# Patient Record
Sex: Female | Born: 1970 | Race: White | Hispanic: No | Marital: Single | Smoking: Current every day smoker
Health system: Southern US, Community
[De-identification: ages and names within clinical notes are randomized; demographics above are authoritative.]

## PROBLEM LIST (undated history)

## (undated) DIAGNOSIS — I10 Essential (primary) hypertension: Secondary | ICD-10-CM

## (undated) DIAGNOSIS — J40 Bronchitis, not specified as acute or chronic: Secondary | ICD-10-CM

## (undated) DIAGNOSIS — G43909 Migraine, unspecified, not intractable, without status migrainosus: Secondary | ICD-10-CM

## (undated) DIAGNOSIS — D72829 Elevated white blood cell count, unspecified: Secondary | ICD-10-CM

## (undated) DIAGNOSIS — E785 Hyperlipidemia, unspecified: Secondary | ICD-10-CM

## (undated) HISTORY — DX: Hyperlipidemia, unspecified: E78.5

## (undated) HISTORY — DX: Migraine, unspecified, not intractable, without status migrainosus: G43.909

## (undated) HISTORY — DX: Elevated white blood cell count, unspecified: D72.829

## (undated) HISTORY — DX: Essential (primary) hypertension: I10

---

## 2004-02-02 ENCOUNTER — Other Ambulatory Visit: Admission: RE | Admit: 2004-02-02 | Discharge: 2004-02-02 | Payer: Self-pay | Admitting: Family Medicine

## 2005-01-09 ENCOUNTER — Other Ambulatory Visit: Admission: RE | Admit: 2005-01-09 | Discharge: 2005-01-09 | Payer: Self-pay | Admitting: Family Medicine

## 2006-01-23 ENCOUNTER — Other Ambulatory Visit: Admission: RE | Admit: 2006-01-23 | Discharge: 2006-01-23 | Payer: Self-pay | Admitting: Family Medicine

## 2007-03-20 ENCOUNTER — Ambulatory Visit: Payer: Self-pay | Admitting: Oncology

## 2007-04-17 HISTORY — PX: TUBAL LIGATION: SHX77

## 2007-05-01 ENCOUNTER — Encounter: Payer: Self-pay | Admitting: Oncology

## 2007-05-01 ENCOUNTER — Other Ambulatory Visit: Admission: RE | Admit: 2007-05-01 | Discharge: 2007-05-01 | Payer: Self-pay | Admitting: Oncology

## 2007-05-01 LAB — TECHNOLOGIST REVIEW

## 2007-05-01 LAB — COMPREHENSIVE METABOLIC PANEL
ALT: 9 U/L (ref 0–35)
AST: 12 U/L (ref 0–37)
Alkaline Phosphatase: 70 U/L (ref 39–117)
Chloride: 102 mEq/L (ref 96–112)
Creatinine, Ser: 0.81 mg/dL (ref 0.40–1.20)
Total Bilirubin: 0.3 mg/dL (ref 0.3–1.2)

## 2007-05-01 LAB — CBC WITH DIFFERENTIAL/PLATELET
Basophils Absolute: 0.1 10*3/uL (ref 0.0–0.1)
Eosinophils Absolute: 0 10*3/uL (ref 0.0–0.5)
HCT: 41 % (ref 34.8–46.6)
HGB: 13.9 g/dL (ref 11.6–15.9)
MCH: 30.6 pg (ref 26.0–34.0)
MONO#: 1.1 10*3/uL — ABNORMAL HIGH (ref 0.1–0.9)
NEUT#: 7.4 10*3/uL — ABNORMAL HIGH (ref 1.5–6.5)
NEUT%: 29.7 % — ABNORMAL LOW (ref 39.6–76.8)
RDW: 13.9 % (ref 11.3–14.5)
WBC: 24.8 10*3/uL — ABNORMAL HIGH (ref 3.9–10.0)
lymph#: 16.1 10*3/uL — ABNORMAL HIGH (ref 0.9–3.3)

## 2007-05-05 LAB — FLOW CYTOMETRY

## 2007-07-02 ENCOUNTER — Ambulatory Visit: Payer: Self-pay | Admitting: Oncology

## 2007-07-04 LAB — CBC WITH DIFFERENTIAL/PLATELET
BASO%: 0.3 % (ref 0.0–2.0)
HCT: 39.7 % (ref 34.8–46.6)
MCHC: 35.2 g/dL (ref 32.0–36.0)
MONO#: 1 10*3/uL — ABNORMAL HIGH (ref 0.1–0.9)
NEUT%: 24.3 % — ABNORMAL LOW (ref 39.6–76.8)
RDW: 13.7 % (ref 11.3–14.5)
WBC: 23.2 10*3/uL — ABNORMAL HIGH (ref 3.9–10.0)
lymph#: 16.4 10*3/uL — ABNORMAL HIGH (ref 0.9–3.3)

## 2007-08-12 ENCOUNTER — Emergency Department (HOSPITAL_COMMUNITY): Admission: EM | Admit: 2007-08-12 | Discharge: 2007-08-12 | Payer: Self-pay | Admitting: Emergency Medicine

## 2007-09-22 ENCOUNTER — Ambulatory Visit: Payer: Self-pay | Admitting: Oncology

## 2007-10-24 ENCOUNTER — Encounter: Payer: Self-pay | Admitting: Family Medicine

## 2007-12-16 LAB — CONVERTED CEMR LAB: Pap Smear: NORMAL

## 2007-12-25 ENCOUNTER — Encounter: Admission: RE | Admit: 2007-12-25 | Discharge: 2007-12-25 | Payer: Self-pay | Admitting: Obstetrics and Gynecology

## 2008-04-14 ENCOUNTER — Ambulatory Visit (HOSPITAL_COMMUNITY): Admission: RE | Admit: 2008-04-14 | Discharge: 2008-04-14 | Payer: Self-pay | Admitting: Obstetrics and Gynecology

## 2008-11-08 ENCOUNTER — Ambulatory Visit: Payer: Self-pay | Admitting: Family Medicine

## 2008-11-08 DIAGNOSIS — E785 Hyperlipidemia, unspecified: Secondary | ICD-10-CM

## 2008-11-08 DIAGNOSIS — D72829 Elevated white blood cell count, unspecified: Secondary | ICD-10-CM

## 2008-11-08 DIAGNOSIS — I10 Essential (primary) hypertension: Secondary | ICD-10-CM

## 2008-11-08 HISTORY — DX: Essential (primary) hypertension: I10

## 2008-11-08 HISTORY — DX: Elevated white blood cell count, unspecified: D72.829

## 2008-11-08 HISTORY — DX: Hyperlipidemia, unspecified: E78.5

## 2009-04-26 ENCOUNTER — Telehealth: Payer: Self-pay | Admitting: Family Medicine

## 2009-09-21 ENCOUNTER — Ambulatory Visit: Payer: Self-pay | Admitting: Family Medicine

## 2009-09-21 DIAGNOSIS — L0291 Cutaneous abscess, unspecified: Secondary | ICD-10-CM

## 2009-09-21 DIAGNOSIS — L0390 Cellulitis, unspecified: Secondary | ICD-10-CM

## 2010-04-28 ENCOUNTER — Ambulatory Visit
Admission: RE | Admit: 2010-04-28 | Discharge: 2010-04-28 | Payer: Self-pay | Source: Home / Self Care | Attending: Family Medicine | Admitting: Family Medicine

## 2010-04-28 DIAGNOSIS — G43909 Migraine, unspecified, not intractable, without status migrainosus: Secondary | ICD-10-CM

## 2010-04-28 HISTORY — DX: Migraine, unspecified, not intractable, without status migrainosus: G43.909

## 2010-05-09 ENCOUNTER — Telehealth: Payer: Self-pay | Admitting: Family Medicine

## 2010-05-16 NOTE — Assessment & Plan Note (Signed)
Summary: EAR PAIN // RS   Vital Signs:  Patient profile:   40 year old female Menstrual status:  regular Temp:     98.0 degrees F oral  Vitals Entered By: Sid Falcon LPN (September 21, 9369 11:55 AM) CC: Rt ear pain, Hypertension Management   History of Present Illness: R ear pain since Monday. Pain in external canal.  No drainage. No fever or chills. Tetanus 2005.  Also needs refills hypertension meds.  No side effects and conpliant with all.  Hypertension History:      She denies headache, chest pain, palpitations, dyspnea with exertion, orthopnea, PND, peripheral edema, visual symptoms, neurologic problems, and syncope.  She notes no problems with any antihypertensive medication side effects.        Positive major cardiovascular risk factors include hyperlipidemia, hypertension, and current tobacco user.  Negative major cardiovascular risk factors include female age less than 79 years old.     Allergies (verified): No Known Drug Allergies  Past History:  Past Medical History: Last updated: 11/08/2008 Migraine headaches Chronic leukocytosis Hyperlipidemia Hypertension Leukocytosis  Review of Systems      See HPI  Physical Exam  General:  Well-developed,well-nourished,in no acute distress; alert,appropriate and cooperative throughout examination Ears:  R canal abscess with swelling and pustular change inf canal. Mouth:  Oral mucosa and oropharynx without lesions or exudates.  Teeth in good repair. Neck:  No deformities, masses, or tenderness noted. Lungs:  Normal respiratory effort, chest expands symmetrically. Lungs are clear to auscultation, no crackles or wheezes. Heart:  Normal rate and regular rhythm. S1 and S2 normal without gallop, murmur, click, rub or other extra sounds.   Impression & Recommendations:  Problem # 1:  ABSCESS, SKIN (ICD-682.9) Assessment New  Orders: I&D Abscess, Complex (10061)  Problem # 2:  HYPERTENSION (ICD-401.9) Assessment:  Unchanged  Her updated medication list for this problem includes:    Lisinopril 5 Mg Tabs (Lisinopril) ..... Once daily    Hydrochlorothiazide 12.5 Mg Caps (Hydrochlorothiazide) ..... Once daily  Complete Medication List: 1)  Lisinopril 5 Mg Tabs (Lisinopril) .... Once daily 2)  Hydrochlorothiazide 12.5 Mg Caps (Hydrochlorothiazide) .... Once daily 3)  Maxalt 10 Mg Tabs (Rizatriptan benzoate) .... One tab onset of headache, may repeat in 2 hours as needed max 2/24 4)  Naprosyn 500 Mg Tabs (Naproxen) .... One tab every 6 hours as needed for headache 5)  Metoclopramide Hcl 10 Mg Tabs (Metoclopramide hcl) .... One tab every 6 hours as needed for headache  Hypertension Assessment/Plan:      The patient's hypertensive risk group is category B: At least one risk factor (excluding diabetes) with no target organ damage.    Patient Instructions: 1)  Use warm compresses several times daily. Prescriptions: HYDROCHLOROTHIAZIDE 12.5 MG CAPS (HYDROCHLOROTHIAZIDE) once daily  #90 x 3   Entered and Authorized by:   Evelena Peat MD   Signed by:   Evelena Peat MD on 09/21/2009   Method used:   Faxed to ...       [facility] (retail)       [phone] [address]       [place], [place]  [address]       Ph: [phone]       Fax: [phone]   RxID:   [id number] LISINOPRIL 5 MG TABS (LISINOPRIL) once daily  #90 x 3   Entered and Authorized by:   Evelena Peat MD   Signed by:   Smitty Cords  Erasto Sleight MD on 09/21/2009   Method used:   Faxed to ...       [facility] (retail)       [phone] [address]       [place], [place]  [address]       Ph: [phone]       Fax: [phone]   RxID:   [id number]   Appended Document: EAR PAIN // RS Pt had noted abscess ear canal.  After discussing risks and benefits of I and D pt consented.  Anesth with 1% plain xylocaine and use #11 blade to lance abscess.  Hemostats used to explore wound cavity.  Abscess drained and pt  tolerated well with minimal bleeding.   Khamani Fairley.

## 2010-05-16 NOTE — Progress Notes (Signed)
Summary: REQ FOR REFILL ON MED Maxalt X 6 months  Phone Note Call from Patient Call back at [phone]   Caller: Patient [phone] Reason for Call: Refill Medication Summary of Call: Pt called to req a refill on med (Maxalt 10 mg).... Pt req that same be sent in to [facility]  [facility]  Initial call taken by: Debbra Riding,  April 26, 2009 11:16 AM  Follow-up for Phone Call        OK to refill Maxalt 10 one at onset of migraine and may repeat one in 2 hours as needed (max 2/24 hours) disp #6 with 6 refills. Follow-up by: Evelena Peat MD,  April 27, 2009 10:53 AM  Additional Follow-up for Phone Call Additional follow up Details #1::        Rx sent electronically    New/Updated Medications: MAXALT 10 MG TABS (RIZATRIPTAN BENZOATE) one tab onset of headache, may repeat in 2 hours as needed max 2/24 Prescriptions: MAXALT 10 MG TABS (RIZATRIPTAN BENZOATE) one tab onset of headache, may repeat in 2 hours as needed max 2/24  #6 x 6   Entered by:   Sid Falcon LPN   Authorized by:   Evelena Peat MD   Signed by:   Sid Falcon LPN on 56/21/3086   Method used:   Electronically to        [facility] (retail)       [address]       [place], [place]  [address]       Ph: [phone]       Fax: [phone]   RxID:   [id number]

## 2010-05-18 NOTE — Progress Notes (Signed)
Summary: One box of Maxalt (18 tabs) requested  Phone Note From Pharmacy   Caller: [facility] Call For: Kayla Mcintosh  Summary of Call: Ins is requiring a box of the Maxalt be disp to pt for cost purposes.  One box has 18 tabs.  New quantity and refills requested. Initial call taken by: Sid Falcon LPN,  May 09, 2010 2:37 PM  Follow-up for Phone Call        OK to refill for 18 tablets. Follow-up by: Evelena Peat MD,  May 09, 2010 2:44 PM    Prescriptions: MAXALT 10 MG TABS (RIZATRIPTAN BENZOATE) one tab onset of headache, may repeat in 2 hours as needed max 2/24  #18 x 3   Entered by:   Sid Falcon LPN   Authorized by:   Evelena Peat MD   Signed by:   Sid Falcon LPN on 96/07/5407   Method used:   Faxed to ...       [facility] (retail)       [phone] [address]       [place], [place]  [address]       Ph: [phone]       Fax: [phone]   RxID:   [id number]

## 2010-05-18 NOTE — Assessment & Plan Note (Signed)
Summary: med check/bp check/refill/cjr   Vital Signs:  Patient profile:   40 year old female Menstrual status:  regular Weight:      116 pounds Temp:     98.2 degrees F oral BP sitting:   108 / 68  (left arm) Cuff size:   regular  Vitals Entered By: Sid Falcon LPN (April 28, 2010 4:31 PM)  History of Present Illness: Here for follow up hypertension and migraine headaches  Rarely has migraines now that she is off OCPs. Uses Maxalt as needed and needs refills.  Supplements with Naprosyn and  Reglan.  BP meds reviewed and compliant with all.  No side effects.  Chronic leukocytosis.  Followed at [facility].  No hx of leukemia. Counts have been stable recently.  Hypertension History:      She denies headache, chest pain, palpitations, dyspnea with exertion, orthopnea, PND, peripheral edema, visual symptoms, neurologic problems, syncope, and side effects from treatment.  She notes no problems with any antihypertensive medication side effects.        Positive major cardiovascular risk factors include hyperlipidemia, hypertension, and current tobacco user.  Negative major cardiovascular risk factors include female age less than 76 years old.     Allergies (verified): No Known Drug Allergies  Past History:  Past Medical History: Last updated: 11/08/2008 Migraine headaches Chronic leukocytosis Hyperlipidemia Hypertension Leukocytosis  Family History: Last updated: 11/08/2008 Family History of CAD Female 1st degree relative <50 Family history breast cancer, grandparent Family History of Alcoholism/Addiction, parent, blood relative Family history lung cancer Family History Hypertension Family history heart disease  Social History: Last updated: 11/08/2008 Married Current Smoker Alcohol use-no  Risk Factors: Smoking Status: current (11/08/2008) PMH-FH-SH reviewed for relevance  Review of Systems  The patient denies anorexia, fever, weight loss, hoarseness, chest pain,  syncope, dyspnea on exertion, peripheral edema, prolonged cough, headaches, hemoptysis, abdominal pain, melena, hematochezia, severe indigestion/heartburn, hematuria, incontinence, muscle weakness, suspicious skin lesions, and depression.    Physical Exam  General:  Well-developed,well-nourished,in no acute distress; alert,appropriate and cooperative throughout examination Eyes:  pupils equal, pupils round, and pupils reactive to light.   Ears:  cerumen bilaterally. Mouth:  Oral mucosa and oropharynx without lesions or exudates.  Teeth in good repair. Neck:  No deformities, masses, or tenderness noted. Lungs:  Normal respiratory effort, chest expands symmetrically. Lungs are clear to auscultation, no crackles or wheezes. Heart:  Normal rate and regular rhythm. S1 and S2 normal without gallop, murmur, click, rub or other extra sounds. Extremities:  No clubbing, cyanosis, edema, or deformity noted with normal full range of motion of all joints.   Neurologic:  alert & oriented X3 and cranial nerves II-XII intact.   Skin:  no rashes and no suspicious lesions.   Cervical Nodes:  No lymphadenopathy noted Psych:  normally interactive, good eye contact, not anxious appearing, and not depressed appearing.     Impression & Recommendations:  Problem # 1:  HYPERTENSION (ICD-401.9)  Her updated medication list for this problem includes:    Lisinopril 5 Mg Tabs (Lisinopril) ..... Once daily    Hydrochlorothiazide 12.5 Mg Caps (Hydrochlorothiazide) ..... Once daily  Problem # 2:  LEUKOCYTOSIS UNSPECIFIED (ICD-288.60)  Problem # 3:  MIGRAINE HEADACHE (ICD-346.90)  Her updated medication list for this problem includes:    Maxalt 10 Mg Tabs (Rizatriptan benzoate) ..... One tab onset of headache, may repeat in 2 hours as needed max 2/24    Naprosyn 500 Mg Tabs (Naproxen) ..... One tab every 6 hours as  needed for headache  Complete Medication List: 1)  Lisinopril 5 Mg Tabs (Lisinopril) .... Once  daily 2)  Hydrochlorothiazide 12.5 Mg Caps (Hydrochlorothiazide) .... Once daily 3)  Maxalt 10 Mg Tabs (Rizatriptan benzoate) .... One tab onset of headache, may repeat in 2 hours as needed max 2/24 4)  Naprosyn 500 Mg Tabs (Naproxen) .... One tab every 6 hours as needed for headache 5)  Metoclopramide Hcl 10 Mg Tabs (Metoclopramide hcl) .... One tab every 6 hours as needed for headache  Hypertension Assessment/Plan:      The patient's hypertensive risk group is category B: At least one risk factor (excluding diabetes) with no target organ damage.  Today's blood pressure is 108/68.    Patient Instructions: 1)  Please schedule a follow-up appointment in 1 year.  Prescriptions: METOCLOPRAMIDE HCL 10 MG TABS (METOCLOPRAMIDE HCL) one tab every 6 hours as needed for headache  #30 x 1   Entered and Authorized by:   Evelena Peat MD   Signed by:   Evelena Peat MD on 04/28/2010   Method used:   Faxed to ...       [facility] (retail)       [phone] [address]       [place], [place]  [address]       Ph: [phone]       Fax: [phone]   RxID:   [id number] NAPROSYN 500 MG TABS (NAPROXEN) one tab every 6 hours as needed for headache  #90 x 3   Entered and Authorized by:   Evelena Peat MD   Signed by:   Evelena Peat MD on 04/28/2010   Method used:   Faxed to ...       [facility] (retail)       [phone] [address]       [place], [place]  [address]       Ph: [phone]       Fax: [phone]   RxID:   [id number] MAXALT 10 MG TABS (RIZATRIPTAN BENZOATE) one tab onset of headache, may repeat in 2 hours as needed max 2/24  #6 x 6   Entered and Authorized by:   Evelena Peat MD   Signed by:   Evelena Peat MD on 04/28/2010   Method used:   Faxed to ...       [facility] (retail)       [phone] [address]       [place], [place]  [address]       Ph: [phone]       Fax: [phone]   RxID:    [id number] HYDROCHLOROTHIAZIDE 12.5 MG CAPS (HYDROCHLOROTHIAZIDE) once daily  #90 Capsule x 3   Entered and Authorized by:   Evelena Peat MD   Signed by:   Evelena Peat MD on 04/28/2010   Method used:   Faxed to ...       [facility] (retail)       [phone] [address]       [place], [place]  [address]       Ph: [phone]       Fax: [phone]   RxID:   [id number] LISINOPRIL 5 MG TABS (LISINOPRIL) once daily  #90 Tablet x 3   Entered and Authorized by:   Evelena Peat MD   Signed by:   Evelena Peat  MD on 04/28/2010   Method used:   Faxed to ...       [facility] (retail)       [phone] [address]       [place], [place]  [address]       Ph: [phone]       Fax: [phone]   RxID:   [id number]    Orders Added: 1)  Est. Patient Level IV [[address]]

## 2010-05-19 ENCOUNTER — Other Ambulatory Visit: Payer: Self-pay | Admitting: Family Medicine

## 2010-08-19 ENCOUNTER — Emergency Department (HOSPITAL_COMMUNITY)
Admission: EM | Admit: 2010-08-19 | Discharge: 2010-08-19 | Disposition: A | Discharge status: Home / Self Care | Payer: BC Managed Care – PPO | Attending: Emergency Medicine | Admitting: Emergency Medicine

## 2010-08-19 DIAGNOSIS — I10 Essential (primary) hypertension: Secondary | ICD-10-CM | POA: Insufficient documentation

## 2010-08-19 DIAGNOSIS — Z0489 Encounter for examination and observation for other specified reasons: Secondary | ICD-10-CM | POA: Insufficient documentation

## 2010-08-19 LAB — DIFFERENTIAL
Band Neutrophils: 0 % (ref 0–10)
Blasts: 0 %
Lymphocytes Relative: 67 % — ABNORMAL HIGH (ref 12–46)
Metamyelocytes Relative: 0 %
Promyelocytes Absolute: 0 %

## 2010-08-19 LAB — CBC
HCT: 43.3 % (ref 36.0–46.0)
Hemoglobin: 15.1 g/dL — ABNORMAL HIGH (ref 12.0–15.0)
RBC: 4.84 MIL/uL (ref 3.87–5.11)
WBC: 29.8 10*3/uL — ABNORMAL HIGH (ref 4.0–10.5)

## 2010-08-19 LAB — RAPID URINE DRUG SCREEN, HOSP PERFORMED
Benzodiazepines: NOT DETECTED
Cocaine: NOT DETECTED
Tetrahydrocannabinol: POSITIVE — AB

## 2010-08-19 LAB — COMPREHENSIVE METABOLIC PANEL
AST: 16 U/L (ref 0–37)
Albumin: 4.4 g/dL (ref 3.5–5.2)
Calcium: 10 mg/dL (ref 8.4–10.5)
Creatinine, Ser: 0.97 mg/dL (ref 0.4–1.2)
GFR calc non Af Amer: >60 mL/min (ref >60)

## 2010-08-19 LAB — POCT PREGNANCY, URINE

## 2010-08-19 LAB — ETHANOL

## 2010-08-21 LAB — PATHOLOGIST SMEAR REVIEW

## 2010-08-22 ENCOUNTER — Encounter: Payer: Self-pay | Admitting: Family Medicine

## 2010-08-23 ENCOUNTER — Ambulatory Visit: Payer: BC Managed Care – PPO | Admitting: Family Medicine

## 2010-08-24 ENCOUNTER — Encounter: Payer: Self-pay | Admitting: Family Medicine

## 2010-08-28 ENCOUNTER — Encounter: Payer: Self-pay | Admitting: Family Medicine

## 2010-08-28 ENCOUNTER — Ambulatory Visit (INDEPENDENT_AMBULATORY_CARE_PROVIDER_SITE_OTHER): Payer: BC Managed Care – PPO | Admitting: Family Medicine

## 2010-08-28 VITALS — BP 110/70 | Temp 98.7°F | Ht 62.0 in | Wt 117.0 lb

## 2010-08-28 DIAGNOSIS — F419 Anxiety disorder, unspecified: Secondary | ICD-10-CM

## 2010-08-28 DIAGNOSIS — I10 Essential (primary) hypertension: Secondary | ICD-10-CM

## 2010-08-28 DIAGNOSIS — D72829 Elevated white blood cell count, unspecified: Secondary | ICD-10-CM

## 2010-08-28 DIAGNOSIS — F411 Generalized anxiety disorder: Secondary | ICD-10-CM

## 2010-08-28 MED ORDER — LORAZEPAM 0.5 MG PO TABS: 0.5000 mg | ORAL_TABLET | Freq: Three times a day (TID) | ORAL | Status: AC

## 2010-08-28 NOTE — Progress Notes (Signed)
  Subjective:    Patient ID: Kayla Mcintosh, female    DOB: 10/22/1970, 40 y.o.   MRN: [id number]  HPI Patient is seen for followup regarding stress issues. Recent stressors of husband with alcohol abuse and increased work stress. She became extremely agitated last week and went to emergency room. Initial concern for suicidal ideation but she denies actual intent. She denies depression symptoms. After evaluation by psychiatry she was released from the emergency department. She was given lorazepam which does help. Poor sleep quality. No history of bipolar illness.  Avoids caffeine use.  Describes episodes of occasional palpitations, diaphoresis, and increased anxiousness. Previously treated with serotonin medications including Celexa but had increased anxiety. She has scheduled followup with psychology at [facility]. Occasionally smokes marijuana. No alcohol use. No other illicit drug use.  She has chronic leukocytosis followed at [facility] with no clear evidence for leukemia. Hypertension treated and stable. No recent orthostatic symptoms. Denies any dizziness or headaches.   Review of Systems  Constitutional: Positive for fatigue. Negative for appetite change and unexpected weight change.  Respiratory: Negative for cough, shortness of breath, wheezing and stridor.   Cardiovascular: Negative for chest pain, palpitations and leg swelling.  Gastrointestinal: Negative for abdominal pain.  Neurological: Negative for dizziness, seizures, syncope, weakness and headaches.  Psychiatric/Behavioral: Positive for sleep disturbance. Negative for suicidal ideas, hallucinations, confusion, self-injury, dysphoric mood and agitation. The patient is nervous/anxious. The patient is not hyperactive.        Objective:   Physical Exam  Constitutional: She is oriented to person, place, and time. She appears well-developed and well-nourished.  Cardiovascular: Normal rate, regular rhythm and normal heart  sounds.   No murmur heard. Pulmonary/Chest: Effort normal and breath sounds normal. No respiratory distress. She has no wheezes. She has no rales.  Neurological: She is alert and oriented to person, place, and time. No cranial nerve deficit.  Psychiatric: She has a normal mood and affect. Her behavior is normal. Judgment and thought content normal.          Assessment & Plan:  Anxiety. This appears to be more situational. She does not have evidence for significant depression at this time. Agree with counseling. Discussed measures to help reduce stress such as exercise. Agreed to limited Ativan 0.5 mg every 8 hours as needed for severe anxiety symptoms. Reassess in 2 weeks

## 2011-05-28 ENCOUNTER — Encounter: Payer: Self-pay | Admitting: Family Medicine

## 2011-05-28 ENCOUNTER — Ambulatory Visit (INDEPENDENT_AMBULATORY_CARE_PROVIDER_SITE_OTHER): Payer: BC Managed Care – PPO | Admitting: Family Medicine

## 2011-05-28 DIAGNOSIS — I10 Essential (primary) hypertension: Secondary | ICD-10-CM

## 2011-05-28 DIAGNOSIS — G43909 Migraine, unspecified, not intractable, without status migrainosus: Secondary | ICD-10-CM

## 2011-05-28 DIAGNOSIS — R252 Cramp and spasm: Secondary | ICD-10-CM

## 2011-05-28 MED ORDER — NAPROXEN 500 MG PO TABS: 500.0000 mg | ORAL_TABLET | Freq: Three times a day (TID) | ORAL | Status: DC

## 2011-05-28 MED ORDER — RIZATRIPTAN BENZOATE 10 MG PO TABS: 10.0000 mg | ORAL_TABLET | ORAL | Status: DC | PRN

## 2011-05-28 MED ORDER — HYDROCHLOROTHIAZIDE 12.5 MG PO CAPS: 12.5000 mg | ORAL_CAPSULE | Freq: Every day | ORAL | Status: DC

## 2011-05-28 MED ORDER — LISINOPRIL 5 MG PO TABS: 5.0000 mg | ORAL_TABLET | Freq: Every day | ORAL | Status: DC

## 2011-05-28 NOTE — Progress Notes (Signed)
  Subjective:    Patient ID: Kayla Mcintosh, female    DOB: [birth year], 41 y.o.   MRN: [id number]  HPI  Followup multiple medical problems. Patient has history of hypertension, hyperlipidemia, chronic leukocytosis followed at [facility] and migraine headaches. Ongoing nicotine use. She takes Maxalt for migraine headaches which seems to be working well. Headaches are fairly infrequent. Hypertension treated with lisinopril and HCTZ. She recently had some bilateral leg cramps especially at night. Her fluid consumption is unchanged. No claudication symptoms. No chest pain or dizziness. Compliant with all medications.  Past Medical History  Diagnosis Date  . HYPERLIPIDEMIA 11/08/2008  . LEUKOCYTOSIS UNSPECIFIED 11/08/2008  . HYPERTENSION 11/08/2008  . MIGRAINE HEADACHE 04/28/2010   No past surgical history on file.  reports that she has been smoking Cigarettes.  She has a 20 pack-year smoking history. She does not have any smokeless tobacco history on file. She reports that she does not drink alcohol. Her drug history not on file. family history includes Alcohol abuse in her other; Cancer in her other; Heart disease in her other; and Hypertension in her other. No Known Allergies    Review of Systems  Constitutional: Negative for appetite change, fatigue and unexpected weight change.  Eyes: Negative for visual disturbance.  Respiratory: Negative for cough, chest tightness, shortness of breath and wheezing.   Cardiovascular: Negative for chest pain, palpitations and leg swelling.  Skin: Negative for rash.  Neurological: Negative for dizziness, seizures, syncope, weakness, light-headedness and headaches.       Objective:   Physical Exam  Constitutional: She is oriented to person, place, and time. She appears well-developed and well-nourished. No distress.  Neck: Neck supple. No thyromegaly present.  Cardiovascular: Normal rate and regular rhythm.   Pulmonary/Chest: Effort normal and  breath sounds normal. No respiratory distress. She has no wheezes. She has no rales.  Musculoskeletal: She exhibits no edema.  Lymphadenopathy:    She has no cervical adenopathy.  Neurological: She is alert and oriented to person, place, and time. No cranial nerve deficit.          Assessment & Plan:  #1 hypertension stable refill medication for one year #2 leg cramps. Check basic metabolic panel on HCTZ  #3 migraine headaches stable refill Maxalt for one year  #4 history of chronic leukocytosis followed at [facility]

## 2011-05-29 LAB — BASIC METABOLIC PANEL
BUN: 15 mg/dL (ref 6–23)
CO2: 27 mEq/L (ref 19–32)
Chloride: 104 mEq/L (ref 96–112)
Creatinine, Ser: 0.7 mg/dL (ref 0.4–1.2)
Glucose, Bld: 65 mg/dL — ABNORMAL LOW (ref 70–99)
Potassium: 4.4 mEq/L (ref 3.5–5.1)

## 2011-05-30 NOTE — Progress Notes (Signed)
Quick Note:  Pt informed on VM ______ 

## 2011-05-31 ENCOUNTER — Other Ambulatory Visit: Payer: Self-pay | Admitting: *Deleted

## 2011-05-31 MED ORDER — NAPROXEN 500 MG PO TABS: 500.0000 mg | ORAL_TABLET | Freq: Three times a day (TID) | ORAL | Status: DC | PRN

## 2011-08-23 ENCOUNTER — Ambulatory Visit (INDEPENDENT_AMBULATORY_CARE_PROVIDER_SITE_OTHER): Payer: No Typology Code available for payment source | Admitting: Family Medicine

## 2011-08-23 ENCOUNTER — Encounter: Payer: Self-pay | Admitting: Family Medicine

## 2011-08-23 VITALS — BP 112/78 | Temp 98.4°F | Wt 115.0 lb

## 2011-08-23 DIAGNOSIS — H60399 Other infective otitis externa, unspecified ear: Secondary | ICD-10-CM

## 2011-08-23 DIAGNOSIS — H6090 Unspecified otitis externa, unspecified ear: Secondary | ICD-10-CM

## 2011-08-23 DIAGNOSIS — H6690 Otitis media, unspecified, unspecified ear: Secondary | ICD-10-CM

## 2011-08-23 MED ORDER — CIPROFLOXACIN-HYDROCORTISONE 0.2-1 % OT SUSP: 3.0000 [drp] | Freq: Two times a day (BID) | OTIC | Status: AC

## 2011-08-23 MED ORDER — AMOXICILLIN 875 MG PO TABS: 875.0000 mg | ORAL_TABLET | Freq: Two times a day (BID) | ORAL | Status: AC

## 2011-08-23 NOTE — Patient Instructions (Signed)
Otitis Media, Adult  A middle ear infection is an infection in the space behind the eardrum. The medical name for this is "otitis media." It may happen after a common cold. It is caused by a germ that starts growing in that space. You may feel swollen glands in your neck on the side of the ear infection.  HOME CARE INSTRUCTIONS   · Take your medicine as directed until it is gone, even if you feel better after the first few days.  · Only take over-the-counter or prescription medicines for pain, discomfort, or fever as directed by your caregiver.  · Occasional use of a nasal decongestant a couple times per day may help with discomfort and help the eustachian tube to drain better.  Follow up with your caregiver in 10 to 14 days or as directed, to be certain that the infection has cleared. Not keeping the appointment could result in a chronic or permanent injury, pain, hearing loss and disability. If there is any problem keeping the appointment, you must call back to this facility for assistance.  SEEK IMMEDIATE MEDICAL CARE IF:   · You are not getting better in 2 to 3 days.  · You have pain that is not controlled with medication.  · You feel worse instead of better.  · You cannot use the medication as directed.  · You develop swelling, redness or pain around the ear or stiffness in your neck.  MAKE SURE YOU:   · Understand these instructions.  · Will watch your condition.  · Will get help right away if you are not doing well or get worse.  Document Released: 01/06/2004 Document Revised: 03/22/2011 Document Reviewed: 11/07/2007  [employer]® Patient Information ©2012 [employer].

## 2011-08-23 NOTE — Progress Notes (Signed)
  Subjective:    Patient ID: Kayla Mcintosh, female    DOB: September 29, 1970, 41 y.o.   MRN: [id number]  HPI  Acute visit. Left ear pain with drainage of brownish to greenish liquid past 3 weeks. Decreased hearing. Minimal pain. No vertigo. Denies any fever or chills. No headaches. No history of frequent ear problems. No alleviating factors. No exacerbating factors.   Review of Systems  Constitutional: Negative for fever and chills.  HENT: Positive for hearing loss, ear pain and ear discharge. Negative for sore throat and neck stiffness.   Neurological: Negative for headaches.       Objective:   Physical Exam  Constitutional: She appears well-developed and well-nourished.  HENT:       Patient has some brownish discharge and cerumen left canal. Removed with curettes. After removal of wax she has fairly intense redness of the eardrum. No obvious perforation. Just some redness involving the external canal as well.  Neck: Neck supple.  Cardiovascular: Normal rate and regular rhythm.   Pulmonary/Chest: Effort normal and breath sounds normal. No respiratory distress. She has no wheezes. She has no rales.  Lymphadenopathy:    She has no cervical adenopathy.          Assessment & Plan:  Acute left otitis media and probably otitis externa as well. Keep ear dry. Cipro HC otic drops 3 drops left ear twice a day. Amoxicillin 875 mg twice a day for 10 days. Followup in 2 weeks if not resolving

## 2011-09-04 ENCOUNTER — Telehealth: Payer: Self-pay | Admitting: Family Medicine

## 2011-09-04 DIAGNOSIS — H6690 Otitis media, unspecified, unspecified ear: Secondary | ICD-10-CM

## 2011-09-04 NOTE — Telephone Encounter (Signed)
Would proceed with ENT referral.  I will order.  Let pt know.

## 2011-09-04 NOTE — Telephone Encounter (Signed)
Pt called and is still having same problem with ear. Pt req referral to ENT as previously discussed with Dr Caryl Never.

## 2011-09-04 NOTE — Telephone Encounter (Signed)
Pt informed on cell VM 

## 2012-05-30 ENCOUNTER — Ambulatory Visit: Payer: No Typology Code available for payment source | Admitting: Family Medicine

## 2012-06-09 ENCOUNTER — Encounter: Payer: Self-pay | Admitting: Family Medicine

## 2012-06-09 ENCOUNTER — Ambulatory Visit (INDEPENDENT_AMBULATORY_CARE_PROVIDER_SITE_OTHER): Payer: 59 | Admitting: Family Medicine

## 2012-06-09 VITALS — BP 120/72 | Temp 99.2°F | Wt 119.0 lb

## 2012-06-09 DIAGNOSIS — I10 Essential (primary) hypertension: Secondary | ICD-10-CM

## 2012-06-09 DIAGNOSIS — G43909 Migraine, unspecified, not intractable, without status migrainosus: Secondary | ICD-10-CM

## 2012-06-09 DIAGNOSIS — IMO0001 Reserved for inherently not codable concepts without codable children: Secondary | ICD-10-CM

## 2012-06-09 MED ORDER — PROMETHAZINE HCL 25 MG PO TABS: ORAL_TABLET | ORAL | Status: DC

## 2012-06-09 MED ORDER — HYDROCHLOROTHIAZIDE 12.5 MG PO CAPS: 12.5000 mg | ORAL_CAPSULE | Freq: Every day | ORAL | Status: DC

## 2012-06-09 MED ORDER — RIZATRIPTAN BENZOATE 10 MG PO TABS: 10.0000 mg | ORAL_TABLET | ORAL | Status: DC | PRN

## 2012-06-09 MED ORDER — NAPROXEN 500 MG PO TABS: 500.0000 mg | ORAL_TABLET | Freq: Three times a day (TID) | ORAL | Status: DC | PRN

## 2012-06-09 MED ORDER — LISINOPRIL 5 MG PO TABS: 5.0000 mg | ORAL_TABLET | Freq: Every day | ORAL | Status: DC

## 2012-06-09 NOTE — Progress Notes (Signed)
  Subjective:    Patient ID: Kayla Mcintosh, female    DOB: 1970/06/24, 42 y.o.   MRN: [id number]  HPI  Innocence is seen today for medical followup  She has a long history of unspecified leukocytosis and is followed by hematology. She states that is stable. They have not diagnosis of any kind of chronic leukemia.  She has hypertension treated with low-dose lisinopril and HCTZ. Electrolytes in the past always been normal. No headaches. No dizziness. Compliant with medications.  History of migraine headaches. Requesting refills of Maxalt. Also requesting refills of oral Phenergan which she uses as needed for migraine. Migraines of been stable.  Patient complains of myalgias over the past year and possibly longer. These are diffuse and did not conform to specific trigger points. She has occasional fatigue. No cold intolerance. No increasing constipation. No arthralgias. No exacerbating factors. No alleviating factors.  Past Medical History  Diagnosis Date  . HYPERLIPIDEMIA 11/08/2008  . LEUKOCYTOSIS UNSPECIFIED 11/08/2008  . HYPERTENSION 11/08/2008  . MIGRAINE HEADACHE 04/28/2010   Past Surgical History  Procedure Laterality Date  . Tubal ligation  2009    reports that she has been smoking Cigarettes.  She has a 20 pack-year smoking history. She does not have any smokeless tobacco history on file. She reports that she does not drink alcohol. Her drug history is not on file. family history includes Alcohol abuse in her other; Cancer in her other; Heart disease in her other; and Hypertension in her other. No Known Allergies    Review of Systems  Constitutional: Negative for chills, appetite change, fatigue and unexpected weight change.  Respiratory: Negative for cough and shortness of breath.   Cardiovascular: Negative for chest pain.  Gastrointestinal: Negative for abdominal pain and abdominal distention.  Endocrine: Negative for polydipsia and polyuria.  Genitourinary:  Negative for dysuria.  Musculoskeletal: Positive for myalgias. Negative for arthralgias.  Skin: Negative for rash.  Hematological: Negative for adenopathy.       Objective:   Physical Exam  Constitutional: She appears well-developed and well-nourished. No distress.  Neck: Neck supple. No thyromegaly present.  Cardiovascular: Normal rate and regular rhythm.   Pulmonary/Chest: Effort normal and breath sounds normal. No respiratory distress. She has no wheezes. She has no rales.  Musculoskeletal:  Joints appear normal. No erythema or warmth. She has multiple diffuse areas of tenderness to palpation involving muscles upper and lower extremities. These did not confirm to specific trigger points that are more diffuse  Lymphadenopathy:    She has no cervical adenopathy.  Skin: No rash noted.          Assessment & Plan:  #1 hypertension. Stable. Refill medications for one year #2 migraine headaches. Refill Phenergan for as needed use. Refill Maxalt.  #3 myalgias. Clinically, her tender areas do not confirm to trigger points. Check labs with TSH,  creatinine kinase, sed rate #4 health maintenance. Patient has not had Pap smear about 5 years. Plans to go back to GYN who did her tubal ligation 5 years ago

## 2012-06-09 NOTE — Patient Instructions (Addendum)
GYN Dr Richarda Overlie

## 2012-06-10 LAB — TSH: TSH: 0.49 u[IU]/mL (ref 0.35–5.50)

## 2012-06-11 ENCOUNTER — Other Ambulatory Visit: Payer: Self-pay | Admitting: *Deleted

## 2012-06-11 MED ORDER — CYCLOBENZAPRINE HCL 5 MG PO TABS: 5.0000 mg | ORAL_TABLET | Freq: Every evening | ORAL | Status: DC | PRN

## 2012-06-11 NOTE — Progress Notes (Signed)
Quick Note:  Pt informed ______ 

## 2012-12-10 ENCOUNTER — Telehealth: Payer: Self-pay | Admitting: Family Medicine

## 2012-12-10 MED ORDER — VARENICLINE TARTRATE 0.5 MG X 11 & 1 MG X 42 PO MISC: ORAL | Status: DC

## 2012-12-10 MED ORDER — VARENICLINE TARTRATE 1 MG PO TABS: 1.0000 mg | ORAL_TABLET | Freq: Two times a day (BID) | ORAL | Status: DC

## 2012-12-10 NOTE — Telephone Encounter (Signed)
Sent and faxed rx

## 2012-12-10 NOTE — Telephone Encounter (Signed)
Patient states she and Dr. Caryl Never have discussed the possibility of going on CHANTIX. She definitely is ready to start this. She is requesting Dr. Caryl Never send in a rx for the [facility] to: [facility] in [place] ([employer] [employer]

## 2012-12-10 NOTE — Telephone Encounter (Signed)
Ok to send in starter pack for Chantix for one month, then maintenance pack with one refill

## 2013-06-18 ENCOUNTER — Encounter: Payer: Self-pay | Admitting: Family Medicine

## 2013-06-18 ENCOUNTER — Ambulatory Visit (INDEPENDENT_AMBULATORY_CARE_PROVIDER_SITE_OTHER): Payer: BC Managed Care – PPO | Admitting: Family Medicine

## 2013-06-18 VITALS — BP 110/80 | HR 68 | Wt 104.0 lb

## 2013-06-18 DIAGNOSIS — I10 Essential (primary) hypertension: Secondary | ICD-10-CM

## 2013-06-18 DIAGNOSIS — G43909 Migraine, unspecified, not intractable, without status migrainosus: Secondary | ICD-10-CM

## 2013-06-18 DIAGNOSIS — Z0000 Encounter for general adult medical examination without abnormal findings: Secondary | ICD-10-CM

## 2013-06-18 DIAGNOSIS — D72829 Elevated white blood cell count, unspecified: Secondary | ICD-10-CM

## 2013-06-18 DIAGNOSIS — R634 Abnormal weight loss: Secondary | ICD-10-CM

## 2013-06-18 MED ORDER — HYDROCHLOROTHIAZIDE 12.5 MG PO CAPS: 12.5000 mg | ORAL_CAPSULE | Freq: Every day | ORAL | Status: DC

## 2013-06-18 MED ORDER — RIZATRIPTAN BENZOATE 10 MG PO TABS: 10.0000 mg | ORAL_TABLET | ORAL | Status: DC | PRN

## 2013-06-18 MED ORDER — NAPROXEN 500 MG PO TABS: 500.0000 mg | ORAL_TABLET | Freq: Three times a day (TID) | ORAL | Status: DC | PRN

## 2013-06-18 MED ORDER — LISINOPRIL 5 MG PO TABS: 5.0000 mg | ORAL_TABLET | Freq: Every day | ORAL | Status: DC

## 2013-06-18 MED ORDER — PROMETHAZINE HCL 25 MG PO TABS: ORAL_TABLET | ORAL | Status: DC

## 2013-06-18 NOTE — Progress Notes (Signed)
   Subjective:    Patient ID: Kayla Mcintosh, female    DOB: 04-20-1970, 43 y.o.   MRN: [id number]  HPI  Patient seen for medical followup. She has history of chronic leukocytosis followed at [facility]. She has some splenomegaly and has pending repeat ultrasound scheduled. Has hypertension treated with lisinopril and HCTZ. Blood pressures been stable. No orthostasis.  She's had significant weight loss since last visit here. She's had about 15 pounds of weight loss which she attributes to stress from separation from her husband a few months ago. She denies feeling depressed. She is generally sleeping okay. No suicidal ideation. She denies any cough, headaches, abdominal pain, or a urine or stool changes. She's not had complete physical in quite some time. She gets regular blood work at [facility] regarding chemistries and CBC.  Migraine headaches and uses Maxalt and Phenergan and is generally works well. Requesting refills.  Past Medical History  Diagnosis Date  . HYPERLIPIDEMIA 11/08/2008  . LEUKOCYTOSIS UNSPECIFIED 11/08/2008  . HYPERTENSION 11/08/2008  . MIGRAINE HEADACHE 04/28/2010   Past Surgical History  Procedure Laterality Date  . Tubal ligation  2009    reports that she has been smoking Cigarettes.  She has a 20 pack-year smoking history. She does not have any smokeless tobacco history on file. She reports that she does not drink alcohol. Her drug history is not on file. family history includes Alcohol abuse in her other; Cancer in her other; Heart disease in her other; Hypertension in her other. No Known Allergies   Review of Systems  Constitutional: Positive for appetite change. Negative for fatigue.  HENT: Negative for trouble swallowing.   Eyes: Negative for visual disturbance.  Respiratory: Negative for cough, chest tightness, shortness of breath and wheezing.   Cardiovascular: Negative for chest pain, palpitations and leg swelling.  Gastrointestinal: Negative  for nausea, vomiting, abdominal pain, diarrhea and blood in stool.  Endocrine: Negative for polydipsia and polyuria.  Genitourinary: Negative for dysuria.  Neurological: Negative for dizziness, seizures, syncope, weakness, light-headedness and headaches.  Psychiatric/Behavioral: Negative for suicidal ideas, sleep disturbance and decreased concentration. The patient is nervous/anxious.        Objective:   Physical Exam  Constitutional: She is oriented to person, place, and time. She appears well-developed and well-nourished.  HENT:  Mouth/Throat: Oropharynx is clear and moist.  Neck: Neck supple. No thyromegaly present.  Pulmonary/Chest: Effort normal and breath sounds normal. No respiratory distress. She has no wheezes. She has no rales. She exhibits no tenderness.  Abdominal: Soft. There is no tenderness.  Splenomegaly noted.    Musculoskeletal: She exhibits no edema.  Lymphadenopathy:    She has no cervical adenopathy.  Neurological: She is alert and oriented to person, place, and time. No cranial nerve deficit.  Psychiatric: She has a normal mood and affect. Her behavior is normal.          Assessment & Plan:  #1 hypertension. Well controlled. Refill medications. We expressed concern about possibly orthostasis with her weight loss but she's not describing any orthostatic symptoms. We gave option of discontinuing HCTZ and she is reluctant to do so #2 migraine headaches. Stable. Refilled Maxalt for as needed use #3 weight loss. She attributes to recent stress issues above. We've offered counseling. Schedule complete physical and further assess. She needs basic things like repeat mammogram, Pap smear, etc. #4 chronic leukocytosis followed at [facility].

## 2013-06-18 NOTE — Patient Instructions (Signed)
Schedule complete physical. 

## 2013-06-18 NOTE — Progress Notes (Signed)
Pre visit review using our clinic review tool, if applicable. No additional management support is needed unless otherwise documented below in the visit note. 

## 2013-06-19 ENCOUNTER — Telehealth: Payer: Self-pay | Admitting: Family Medicine

## 2013-06-19 NOTE — Telephone Encounter (Signed)
Relevant patient education mailed to patient.  

## 2013-06-19 NOTE — Telephone Encounter (Signed)
emmi report mailed to patient ° °

## 2013-07-06 ENCOUNTER — Telehealth: Payer: Self-pay

## 2013-07-06 ENCOUNTER — Other Ambulatory Visit (INDEPENDENT_AMBULATORY_CARE_PROVIDER_SITE_OTHER): Payer: BC Managed Care – PPO

## 2013-07-06 ENCOUNTER — Ambulatory Visit: Payer: BC Managed Care – PPO

## 2013-07-06 DIAGNOSIS — Z0000 Encounter for general adult medical examination without abnormal findings: Secondary | ICD-10-CM

## 2013-07-06 LAB — LIPID PANEL
CHOL/HDL RATIO: 6
CHOLESTEROL: 179 mg/dL (ref 0–200)
HDL: 29.3 mg/dL — ABNORMAL LOW (ref >39.00)
LDL CALC: 130 mg/dL — AB (ref 0–99)
Triglycerides: 101 mg/dL (ref 0.0–149.0)
VLDL: 20.2 mg/dL (ref 0.0–40.0)

## 2013-07-06 LAB — CBC WITH DIFFERENTIAL/PLATELET
BASOS PCT: 0.1 % (ref 0.0–3.0)
Basophils Absolute: 0 10*3/uL (ref 0.0–0.1)
Eosinophils Absolute: 0 10*3/uL (ref 0.0–0.7)
Eosinophils Relative: 0.1 % (ref 0.0–5.0)
HCT: 41.2 % (ref 36.0–46.0)
Hemoglobin: 13.5 g/dL (ref 12.0–15.0)
Lymphocytes Relative: 75.8 % — ABNORMAL HIGH (ref 12.0–46.0)
Lymphs Abs: 20.5 10*3/uL — ABNORMAL HIGH (ref 0.7–4.0)
MCHC: 32.9 g/dL (ref 30.0–36.0)
MCV: 92.3 fl (ref 78.0–100.0)
MONO ABS: 2.1 10*3/uL — AB (ref 0.1–1.0)
Monocytes Relative: 7.7 % (ref 3.0–12.0)
NEUTROS PCT: 16.3 % — AB (ref 43.0–77.0)
Neutro Abs: 4.4 10*3/uL (ref 1.4–7.7)
Platelets: 136 10*3/uL — ABNORMAL LOW (ref 150.0–400.0)
RBC: 4.46 Mil/uL (ref 3.87–5.11)
RDW: 15.1 % — AB (ref 11.5–14.6)
WBC: 27 10*3/uL (ref 4.5–10.5)

## 2013-07-06 LAB — POCT URINALYSIS DIPSTICK
Bilirubin, UA: NEGATIVE
Glucose, UA: NEGATIVE
KETONES UA: NEGATIVE
Leukocytes, UA: NEGATIVE
Nitrite, UA: NEGATIVE
PROTEIN UA: NEGATIVE
SPEC GRAV UA: 1.01
Urobilinogen, UA: 0.2
pH, UA: 5.5

## 2013-07-06 LAB — HEPATIC FUNCTION PANEL
ALBUMIN: 4 g/dL (ref 3.5–5.2)
ALT: 13 U/L (ref 0–35)
AST: 15 U/L (ref 0–37)
Alkaline Phosphatase: 61 U/L (ref 39–117)
Bilirubin, Direct: 0 mg/dL (ref 0.0–0.3)
TOTAL PROTEIN: 7.5 g/dL (ref 6.0–8.3)
Total Bilirubin: 0.5 mg/dL (ref 0.3–1.2)

## 2013-07-06 LAB — BASIC METABOLIC PANEL
BUN: 20 mg/dL (ref 6–23)
CALCIUM: 9.2 mg/dL (ref 8.4–10.5)
CO2: 28 mEq/L (ref 19–32)
Chloride: 102 mEq/L (ref 96–112)
Creatinine, Ser: 0.8 mg/dL (ref 0.4–1.2)
GFR: 84.2 mL/min (ref >60.00)
GLUCOSE: 92 mg/dL (ref 70–99)
Potassium: 4.2 mEq/L (ref 3.5–5.1)
SODIUM: 136 meq/L (ref 135–145)

## 2013-07-06 LAB — TSH: TSH: 0.22 u[IU]/mL — ABNORMAL LOW (ref 0.35–5.50)

## 2013-07-06 NOTE — Telephone Encounter (Signed)
Critical lab: 27.0 wbc

## 2013-07-06 NOTE — Telephone Encounter (Signed)
She has chronic elevated WBC and this is stable for her.

## 2013-07-07 LAB — PATHOLOGIST SMEAR REVIEW

## 2013-07-15 ENCOUNTER — Encounter: Payer: Self-pay | Admitting: Family Medicine

## 2013-07-15 ENCOUNTER — Other Ambulatory Visit (HOSPITAL_COMMUNITY)
Admission: RE | Admit: 2013-07-15 | Discharge: 2013-07-15 | Disposition: A | Discharge status: Home / Self Care | Payer: BC Managed Care – PPO | Source: Ambulatory Visit | Attending: Family Medicine | Admitting: Family Medicine

## 2013-07-15 ENCOUNTER — Ambulatory Visit (INDEPENDENT_AMBULATORY_CARE_PROVIDER_SITE_OTHER): Payer: BC Managed Care – PPO | Admitting: Family Medicine

## 2013-07-15 VITALS — BP 110/78 | HR 80 | Temp 98.6°F | Ht 61.5 in | Wt 101.0 lb

## 2013-07-15 DIAGNOSIS — R946 Abnormal results of thyroid function studies: Secondary | ICD-10-CM

## 2013-07-15 DIAGNOSIS — Z23 Encounter for immunization: Secondary | ICD-10-CM

## 2013-07-15 DIAGNOSIS — R079 Chest pain, unspecified: Secondary | ICD-10-CM

## 2013-07-15 DIAGNOSIS — Z01419 Encounter for gynecological examination (general) (routine) without abnormal findings: Secondary | ICD-10-CM | POA: Insufficient documentation

## 2013-07-15 DIAGNOSIS — R634 Abnormal weight loss: Secondary | ICD-10-CM

## 2013-07-15 DIAGNOSIS — R319 Hematuria, unspecified: Secondary | ICD-10-CM

## 2013-07-15 DIAGNOSIS — Z0000 Encounter for general adult medical examination without abnormal findings: Secondary | ICD-10-CM

## 2013-07-15 LAB — POCT URINALYSIS DIPSTICK
Blood, UA: NEGATIVE
Glucose, UA: NEGATIVE
LEUKOCYTES UA: NEGATIVE
Nitrite, UA: NEGATIVE
PH UA: 5.5
Spec Grav, UA: >=1.03
UROBILINOGEN UA: 1

## 2013-07-15 LAB — T3, FREE: T3 FREE: 3.7 pg/mL (ref 2.3–4.2)

## 2013-07-15 LAB — T4, FREE: FREE T4: 0.95 ng/dL (ref 0.60–1.60)

## 2013-07-15 LAB — TSH: TSH: 1.38 u[IU]/mL (ref 0.35–5.50)

## 2013-07-15 NOTE — Progress Notes (Signed)
Pre visit review using our clinic review tool, if applicable. No additional management support is needed unless otherwise documented below in the visit note. 

## 2013-07-15 NOTE — Patient Instructions (Signed)
Set up mammogram We will be setting up stress test of heart Consider baby aspirin one daily.

## 2013-07-15 NOTE — Progress Notes (Signed)
Subjective:    Patient ID: Kayla Mcintosh, female    DOB: Aug 31, 1970, 43 y.o.   MRN: [id number]  HPI Patient here for complete physical. She has history of hypertension, dyslipidemia, chronic leukocytosis followed at [facility]. She's had previous migraine headaches. Ongoing nicotine use. She's not had a physical in over 5 years. She had baseline mammogram age 53 and none since then. Last Pap smear estimated 5 years ago. Last tetanus 10 years ago.  Extremely strong family history of heart disease. Her father had MI age 16 died age 78 following heart transplant. Patient relates some intermittent symptoms of chest pain lasting 3 to 4 minutes over the past several weeks. Not consistently related to exertion. Patient does have dyspnea with exertion. No left arm symptoms. Occasional lightheadedness and dizziness. She's had some weight loss in recent months. No diarrhea. No tremors. She has been dealing with the stress of separation from her husband and just earlier today discovered that her daughter is pregnant.    Past Medical History  Diagnosis Date  . HYPERLIPIDEMIA 11/08/2008  . LEUKOCYTOSIS UNSPECIFIED 11/08/2008  . HYPERTENSION 11/08/2008  . MIGRAINE HEADACHE 04/28/2010   Past Surgical History  Procedure Laterality Date  . Tubal ligation  2009    reports that she has been smoking Cigarettes.  She has a 20 pack-year smoking history. She does not have any smokeless tobacco history on file. She reports that she does not drink alcohol. Her drug history is not on file. family history includes Alcohol abuse in her other; Cancer in her other; Heart disease in her other; Hypertension in her other. No Known Allergies    Review of Systems  Constitutional: Positive for unexpected weight change. Negative for fever, activity change, appetite change and fatigue.  HENT: Negative for ear pain, hearing loss, sore throat and trouble swallowing.   Eyes: Negative for visual disturbance.    Respiratory: Negative for cough and shortness of breath.   Cardiovascular: Positive for chest pain. Negative for palpitations.  Gastrointestinal: Negative for abdominal pain, diarrhea, constipation and blood in stool.  Endocrine: Negative for polydipsia and polyuria.  Genitourinary: Negative for dysuria and hematuria.  Musculoskeletal: Negative for arthralgias, back pain and myalgias.  Skin: Negative for rash.  Neurological: Negative for dizziness, syncope and headaches.  Hematological: Negative for adenopathy.  Psychiatric/Behavioral: Negative for confusion and dysphoric mood.       Objective:   Physical Exam  Constitutional: She is oriented to person, place, and time. She appears well-developed and well-nourished.  HENT:  Head: Normocephalic and atraumatic.  Eyes: EOM are normal. Pupils are equal, round, and reactive to light.  Neck: Normal range of motion. Neck supple. No thyromegaly present.  Cardiovascular: Normal rate, regular rhythm and normal heart sounds.   No murmur heard. Pulmonary/Chest: Breath sounds normal. No respiratory distress. She has no wheezes. She has no rales.  Abdominal: Soft. Bowel sounds are normal. She exhibits no distension and no mass. There is no tenderness. There is no rebound and no guarding.  Genitourinary:  Breasts are symmetric with no distinct masses. She in general has fairly dense breast tissue.\ Pelvic exam normal external genitalia. Vaginal mucosa and cervix appear normal. Pap smear obtained. Bimanual minimal tenderness right and left adnexa with no palpated masses  Musculoskeletal: Normal range of motion. She exhibits no edema.  Lymphadenopathy:    She has no cervical adenopathy.  Neurological: She is alert and oriented to person, place, and time. She displays normal reflexes. No cranial nerve deficit.  Skin:  No rash noted.  Psychiatric: She has a normal mood and affect. Her behavior is normal. Judgment and thought content normal.           Assessment & Plan:  #1 complete physical. Obtain Pap smear, mammogram. Discussed smoking cessation. Tetanus booster given. An #2 recent intermittent chest pains. Somewhat atypical features. Very strong family history and other risk factors including dyslipidemia and nicotine use. Check EKG. Consider stress test evaluation EKG shows normal sinus rhythm with no acute changes #3 chronic leukocytosis followed [facility] #4 low TSH. Repeat TSH along with free T3 and T4. #5 trace blood on urine dipstick initially. Repeat today is negative

## 2013-07-27 ENCOUNTER — Encounter: Payer: Self-pay | Admitting: Physician Assistant

## 2013-07-27 ENCOUNTER — Ambulatory Visit (INDEPENDENT_AMBULATORY_CARE_PROVIDER_SITE_OTHER): Payer: BC Managed Care – PPO | Admitting: Physician Assistant

## 2013-07-27 VITALS — BP 118/70 | HR 73 | Temp 98.1°F | Resp 16 | Ht 61.5 in | Wt 104.0 lb

## 2013-07-27 DIAGNOSIS — T304 Corrosion of unspecified body region, unspecified degree: Secondary | ICD-10-CM

## 2013-07-27 DIAGNOSIS — T300 Burn of unspecified body region, unspecified degree: Secondary | ICD-10-CM

## 2013-07-27 DIAGNOSIS — IMO0002 Reserved for concepts with insufficient information to code with codable children: Secondary | ICD-10-CM

## 2013-07-27 NOTE — Progress Notes (Signed)
Pre visit review using our clinic review tool, if applicable. No additional management support is needed unless otherwise documented below in the visit note. 

## 2013-07-27 NOTE — Patient Instructions (Signed)
Try again to rub layer of either fragrance free lotions, or hydrocortisone creams again as tolerated to provide a barrier for the burn.   Wear gloves in the future when using bleach products and other cleaning materials.  F/U as needed or if symptoms worsen or do not improve despite treatment.  Chemical Burn Chemicals can burn the skin. A chemical burn should be rinsed with cool water and checked by an emergency doctor. Burn care is important to stop infection. Keep chemicals out of reach of children. Wear safety gloves when handling chemicals. HOME CARE  Wash your hands well before you change your bandage.  Change your bandage as often as told by your doctor.  Remove the old bandage. If the bandage sticks, soak it off with cool, clean water.  Gently clean the burn with mild soap and water.  Pat the burn dry with a clean, dry cloth.  Put a thin layer of medicated cream on the burn.  Put a clean bandage on as told by your doctor.  Keep the bandage clean and dry.  Raise (elevate) the burn for the first 24 hours. After that, follow your doctor's directions.  Only take medicines as told by your doctor.  Keep all your doctor visits. GET HELP RIGHT AWAY IF:  You have too much pain.  The skin near the burn is red, tender, puffy (swollen), or has red streaks.  The burn area has yellowish-white fluid (pus) or a bad smell coming from it.  You have a fever. MAKE SURE YOU:   Understand these instructions.  Will watch your condition.  Will get help right away if you are not doing well or get worse. Document Released: 05/10/2004 Document Revised: 06/25/2011 Document Reviewed: 09/28/2010 [facility] Patient Information 2014 [place].
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
## 2014-01-20 ENCOUNTER — Ambulatory Visit (INDEPENDENT_AMBULATORY_CARE_PROVIDER_SITE_OTHER): Payer: BC Managed Care – PPO | Admitting: Family Medicine

## 2014-01-20 ENCOUNTER — Encounter: Payer: Self-pay | Admitting: Family Medicine

## 2014-01-20 VITALS — BP 110/78 | HR 90 | Temp 97.7°F | Wt 107.0 lb

## 2014-01-20 DIAGNOSIS — J209 Acute bronchitis, unspecified: Secondary | ICD-10-CM

## 2014-01-20 MED ORDER — AMOXICILLIN 875 MG PO TABS: 875.0000 mg | ORAL_TABLET | Freq: Two times a day (BID) | ORAL | Status: DC

## 2014-01-20 MED ORDER — PREDNISONE 10 MG PO TABS: ORAL_TABLET | ORAL | Status: DC

## 2014-01-20 MED ORDER — ALBUTEROL SULFATE HFA 108 (90 BASE) MCG/ACT IN AERS: 2.0000 | INHALATION_SPRAY | RESPIRATORY_TRACT | Status: DC | PRN

## 2014-01-20 NOTE — Patient Instructions (Signed)
Follow up for any increase shortness of breath or increased fever.

## 2014-01-20 NOTE — Progress Notes (Signed)
   Subjective:    Patient ID: Kayla Mcintosh, female    DOB: 1971/01/21, 43 y.o.   MRN: [id number]  Cough Associated symptoms include headaches. Pertinent negatives include no chills or fever.   Patient has had productive cough for the past week. She smokes less than half-pack cigarettes per day. She's not any fevers or chills but had some nasal congestion. Cough productive of thick yellow-brown sputum. She's had some wheezing off and on. No history of asthma. No recent appetite or weight changes. Patient had migraine headache couple weeks ago which is finally resolved.  She has some fleeting upper abdominal pain early this morning after coffee.  Had ultrasound at [facility] few weeks ago that showed possible gallstones. No abdominal pain at this time  Past Medical History  Diagnosis Date  . HYPERLIPIDEMIA 11/08/2008  . LEUKOCYTOSIS UNSPECIFIED 11/08/2008  . HYPERTENSION 11/08/2008  . MIGRAINE HEADACHE 04/28/2010   Past Surgical History  Procedure Laterality Date  . Tubal ligation  2009    reports that she has been smoking Cigarettes.  She has a 20 pack-year smoking history. She does not have any smokeless tobacco history on file. She reports that she does not drink alcohol. Her drug history is not on file. family history includes Alcohol abuse in her other; Cancer in her other; Heart disease in her other; Hypertension in her other. No Known Allergies    Review of Systems  Constitutional: Negative for fever and chills.  HENT: Positive for congestion.   Respiratory: Positive for cough.   Neurological: Positive for headaches.       Objective:   Physical Exam  Constitutional: She appears well-developed and well-nourished.  HENT:  Right Ear: External ear normal.  Left Ear: External ear normal.  Mouth/Throat: Oropharynx is clear and moist.  Neck: Neck supple.  Cardiovascular: Normal rate and regular rhythm.   Pulmonary/Chest: Effort normal. No respiratory distress. She has wheezes.  She has no rales.  Lymphadenopathy:    She has no cervical adenopathy.          Assessment & Plan:  Cough with reactive airway component. Recommend she quit smoking. Prednisone taper over one week. Amoxicillin 875 mg twice a day for 10 days. Pro-air inhaler 2 puffs every 4 hours when necessary for cough and wheeze

## 2014-01-20 NOTE — Progress Notes (Signed)
Pre visit review using our clinic review tool, if applicable. No additional management support is needed unless otherwise documented below in the visit note. 

## 2014-03-08 ENCOUNTER — Telehealth: Payer: Self-pay | Admitting: Family Medicine

## 2014-03-08 NOTE — Telephone Encounter (Signed)
Pt has been having her menstrual cycle for last 26 day out of 30 day. Pt would like orthocycline [facility] [address] in [place]. Pt stated md gave her medication in past

## 2014-03-08 NOTE — Telephone Encounter (Signed)
Needs to be seen.  OCPs have much higher risk in smokers.  Need to evaluate her etiology of prolonged spotting. Other option would be going ahead and seeing gyn.

## 2014-03-08 NOTE — Telephone Encounter (Signed)
Called to inform that Dr. B would like patient to be seen. Pt started cussing at me on the phone stating that she was just seen for a pap smear. Pt stated that she was on her way up here to the office, informed patient that we dont have any appointments today with Dr. Jacinto Reap. Patient hangs up the phone on me.

## 2014-03-08 NOTE — Telephone Encounter (Signed)
Pt has been sch for tomorrow °

## 2014-03-09 ENCOUNTER — Ambulatory Visit (INDEPENDENT_AMBULATORY_CARE_PROVIDER_SITE_OTHER): Payer: BC Managed Care – PPO | Admitting: Family Medicine

## 2014-03-09 ENCOUNTER — Encounter: Payer: Self-pay | Admitting: Family Medicine

## 2014-03-09 VITALS — BP 110/70 | HR 88 | Temp 97.8°F | Wt 107.0 lb

## 2014-03-09 DIAGNOSIS — N921 Excessive and frequent menstruation with irregular cycle: Secondary | ICD-10-CM

## 2014-03-09 NOTE — Progress Notes (Signed)
Patient had called yesterday with complaints of 25 days of continuous vaginal bleeding. She specifically requested that we call in oral contraceptive pills. She is a 43 year old smoker and we explained that she would need to be evaluated medically first. Yesterday when she called she apparently became very hostile and report is that she was cursing with our staff and initially refused to come in. We explained that calling in oral contraceptive pills to a 43 year old smoker with history of migraines without evaluating etiology of her bleeding would not be good medical practice-notwithstanding the fact that OCPs carry substantial risk in 41 year old smokers with migraine history. She was very upset with this answer according to staff and cursed and was belligerent over the phone.  She did present to our practice today. Upon entering the room she seemed agitated and somewhat withdrawn and did not make eye contact and was very evasive with answers. When we acknowledged that she seemed very agitated she replied "what does it matter?"  At that point, I explored depression issues and she adamantly denied being depressed. I inquired whether she had any thoughts of harming herself and initially she wouldn't answer. After further questioning she eventually denied that she was at imminent risk of harming herself.   We started questioning her regarding her vaginal bleeding and reviewing recent labs. In the middle of getting further history regarding her bleeding issues she put her coat on and got up and stormed out of the room stating that "you only want to charge 1,000 dollars and all I need is birth control pills." She stormed out of the building and one of our front office workers heard her say as she exited the building "I guess I'll just kill myself".   When I was notified of this I notified our Environmental education officer and we will contact law enforcement as she was refusing help while she was here.  We did our very best  to try and calm her and do an appropriate medical evaluation while she was here and she refused our help.

## 2014-03-09 NOTE — Telephone Encounter (Signed)
Pt called back to schedule her appt.  Not having spore to the pt, I asked her what she needed to be seen for. Pt was very irritated with me, pt starting yelling, "my p....Kayla Mcintosh is bleeding!!"   When we did not have any appts, she said: "I guess y'all  want me to die." Then i asked [name] if we could work the pt in. montrice told me to schedule her at the last appt on dr burchette's schedule for tues. Pt was scheduled.

## 2014-03-10 ENCOUNTER — Telehealth: Payer: Self-pay | Admitting: Family Medicine

## 2014-03-10 NOTE — Telephone Encounter (Signed)
Contacted the [employer] on yesterday, 04/16/73 to request welfare check at the request of Dr. Elease Hashimoto after staff member overheard pt state "I guess I'll just kill myself" as she left the building.  I spoke with the Police Dept this morning to following up on request for welfare check and was informed that police was dispatched to patient's home yesterday and arrived around 2:39pm.  The patient's home was secured and they were unable to get anyone to the door. They attempted to contact the patient via phone but only got voicemail.  A message was left for patient to contact the Police Dept.  However, as of this morning the patient had not contacted them.  The police was also unable to contact family members of the patient.  I contacted this patient this morning at approximately 8:30 and left a voicemail to return my call.  The patient returned my phone call at 7:40.  I asked patient how she was feeling and she said she was fine but she was upset because she felt she didn't have anything done yesterday during her office visit.  I advised pt that per Dr. Elease Hashimoto she had walked out of the office before he could assist her and the patient agreed she left because she was upset.  I asked pt if there was anything I or Dr. Elease Hashimoto could assist her with today and she said no, there was nothing we could do the assist her and to "just leave her alone".

## 2014-07-16 ENCOUNTER — Telehealth: Payer: Self-pay | Admitting: Family Medicine

## 2014-07-16 MED ORDER — LISINOPRIL 5 MG PO TABS: 5.0000 mg | ORAL_TABLET | Freq: Every day | ORAL | Status: DC

## 2014-07-16 MED ORDER — HYDROCHLOROTHIAZIDE 12.5 MG PO CAPS: 12.5000 mg | ORAL_CAPSULE | Freq: Every day | ORAL | Status: DC

## 2014-07-16 NOTE — Telephone Encounter (Signed)
Rx sent to pharmacy   

## 2014-07-16 NOTE — Telephone Encounter (Signed)
Pt request refill of the following: hydrochlorothiazide (MICROZIDE) 12.5 MG capsule,   lisinopril (PRINIVIL,ZESTRIL) 5 MG tablet  Pt is scheduled for an appt on Mon and said she has not had her medicine in a week     Phamacy: [facility]

## 2014-07-19 ENCOUNTER — Ambulatory Visit (INDEPENDENT_AMBULATORY_CARE_PROVIDER_SITE_OTHER): Payer: BLUE CROSS/BLUE SHIELD | Admitting: Family Medicine

## 2014-07-19 ENCOUNTER — Encounter: Payer: Self-pay | Admitting: Family Medicine

## 2014-07-19 VITALS — BP 110/80 | HR 84 | Temp 98.3°F | Wt 116.0 lb

## 2014-07-19 DIAGNOSIS — G43809 Other migraine, not intractable, without status migrainosus: Secondary | ICD-10-CM

## 2014-07-19 DIAGNOSIS — I10 Essential (primary) hypertension: Secondary | ICD-10-CM

## 2014-07-19 MED ORDER — LISINOPRIL 5 MG PO TABS: 5.0000 mg | ORAL_TABLET | Freq: Every day | ORAL | Status: DC

## 2014-07-19 MED ORDER — CYCLOBENZAPRINE HCL 5 MG PO TABS: 5.0000 mg | ORAL_TABLET | Freq: Every evening | ORAL | Status: DC | PRN

## 2014-07-19 MED ORDER — PROMETHAZINE HCL 25 MG PO TABS: ORAL_TABLET | ORAL | Status: DC

## 2014-07-19 MED ORDER — RIZATRIPTAN BENZOATE 10 MG PO TABS: 10.0000 mg | ORAL_TABLET | ORAL | Status: DC | PRN

## 2014-07-19 MED ORDER — HYDROCHLOROTHIAZIDE 12.5 MG PO CAPS: 12.5000 mg | ORAL_CAPSULE | Freq: Every day | ORAL | Status: DC

## 2014-07-19 MED ORDER — NAPROXEN 500 MG PO TABS: 500.0000 mg | ORAL_TABLET | Freq: Three times a day (TID) | ORAL | Status: DC | PRN

## 2014-07-19 NOTE — Progress Notes (Signed)
Pre visit review using our clinic review tool, if applicable. No additional management support is needed unless otherwise documented below in the visit note. 

## 2014-07-19 NOTE — Progress Notes (Signed)
   Subjective:    Patient ID: Kayla Mcintosh, female    DOB: 06/15/1970, 44 y.o.   MRN: [id number]  HPI   Patient here for medical follow-up. She has hypertension treated with HCTZ and low-dose lisinopril. She has actually been off her medication past few days. Blood pressure has remained stable. No headaches. She is very reluctant to scale back her medications. She has migraine headaches but no other type headache recently. No dizziness.  Migraine headaches. She thinks these are menstrual related. Responded well to combination of Maxalt along with naproxen and Phenergan. We have discussed potential prophylaxis that she has about 4 per month but she is not interested.  Chronic leukocytosis followed at [facility]. Recent white count 17,000 which is lower than this has been in some time  Past Medical History  Diagnosis Date  . HYPERLIPIDEMIA 11/08/2008  . LEUKOCYTOSIS UNSPECIFIED 11/08/2008  . HYPERTENSION 11/08/2008  . MIGRAINE HEADACHE 04/28/2010   Past Surgical History  Procedure Laterality Date  . Tubal ligation  2009    reports that she has been smoking Cigarettes.  She has a 20 pack-year smoking history. She does not have any smokeless tobacco history on file. She reports that she does not drink alcohol. Her drug history is not on file. family history includes Alcohol abuse in her other; Cancer in her other; Heart disease in her other; Hypertension in her other. No Known Allergies   Review of Systems  Constitutional: Negative for appetite change, fatigue and unexpected weight change.  Eyes: Negative for visual disturbance.  Respiratory: Negative for cough, chest tightness, shortness of breath and wheezing.   Cardiovascular: Negative for chest pain, palpitations and leg swelling.  Neurological: Positive for headaches. Negative for dizziness, seizures, syncope, weakness and light-headedness.       Objective:   Physical Exam  Constitutional: She is oriented to person, place,  and time. She appears well-developed and well-nourished. No distress.  Neck: Neck supple. No thyromegaly present.  Cardiovascular: Normal rate and regular rhythm.   Pulmonary/Chest: Effort normal and breath sounds normal. No respiratory distress. She has no wheezes. She has no rales.  Musculoskeletal: She exhibits no edema.  Neurological: She is alert and oriented to person, place, and time.          Assessment & Plan:  #1 hypertension. Stable. We gave patient option of discontinuing lisinopril but she is reluctant. Refill lisinopril and HCTZ for one year #2 history of migraine headaches. Refill Maxalt, Phenergan, and naproxen for as needed use. We discussed prophylactic options but she is not interested  #3 chronic leukocytosis followed at [facility]

## 2014-10-11 ENCOUNTER — Encounter: Payer: Self-pay | Admitting: Internal Medicine

## 2014-10-11 ENCOUNTER — Ambulatory Visit (INDEPENDENT_AMBULATORY_CARE_PROVIDER_SITE_OTHER): Payer: BLUE CROSS/BLUE SHIELD | Admitting: Internal Medicine

## 2014-10-11 VITALS — BP 108/70 | HR 94 | Temp 98.1°F | Resp 20 | Ht 61.5 in | Wt 114.0 lb

## 2014-10-11 DIAGNOSIS — J069 Acute upper respiratory infection, unspecified: Secondary | ICD-10-CM

## 2014-10-11 DIAGNOSIS — B9789 Other viral agents as the cause of diseases classified elsewhere: Principal | ICD-10-CM

## 2014-10-11 MED ORDER — HYDROCODONE-HOMATROPINE 5-1.5 MG/5ML PO SYRP
5.0000 mL | ORAL_SOLUTION | Freq: Four times a day (QID) | ORAL | Status: DC | PRN
Start: 2014-10-11 — End: 2014-11-12

## 2014-10-11 NOTE — Progress Notes (Signed)
Subjective:    Patient ID: Kayla Mcintosh, female    DOB: [birth year], 44 y.o.   MRN: [id number]  HPI  44 year old patient who has treated hypertension and also a history of ongoing tobacco use.  She presents with a two-week history of largely dry, nonproductive cough. She has been challenges off blood pressure medications, but she has resumed both diuretic therapy and lisinopril.  She states that she feels better on the medication.  Blood pressure remains in a low-normal range.  Past Medical History  Diagnosis Date  . HYPERLIPIDEMIA 11/08/2008  . LEUKOCYTOSIS UNSPECIFIED 11/08/2008  . HYPERTENSION 11/08/2008  . MIGRAINE HEADACHE 04/28/2010    History   Social History  . Marital Status: Married    Spouse Name: N/A  . Number of Children: N/A  . Years of Education: N/A   Occupational History  . Not on file.   Social History Main Topics  . Smoking status: Current Every Day Smoker -- 1.00 packs/day for 20 years    Types: Cigarettes  . Smokeless tobacco: Not on file  . Alcohol Use: No  . Drug Use: Not on file  . Sexual Activity: Not on file   Other Topics Concern  . Not on file   Social History Narrative    Past Surgical History  Procedure Laterality Date  . Tubal ligation  2009    Family History  Problem Relation Age of Onset  . Heart disease Other   . Cancer Other     breast grandmother, lung  . Alcohol abuse Other     parent, blood relative  . Hypertension Other     No Known Allergies  Current Outpatient Prescriptions on File Prior to Visit  Medication Sig Dispense Refill  . cyclobenzaprine (FLEXERIL) 5 MG tablet Take 1 tablet (5 mg total) by mouth at bedtime as needed. 30 tablet 2  . hydrochlorothiazide (MICROZIDE) 12.5 MG capsule Take 1 capsule (12.5 mg total) by mouth daily. 90 capsule 3  . lisinopril (PRINIVIL,ZESTRIL) 5 MG tablet Take 1 tablet (5 mg total) by mouth daily. 90 tablet 3  . naproxen (NAPROSYN) 500 MG tablet Take 1 tablet (500 mg total) by  mouth 3 (three) times daily as needed. 60 tablet 3  . promethazine (PHENERGAN) 25 MG tablet Use prn as instructed for migraine headaches. 30 tablet 2  . rizatriptan (MAXALT) 10 MG tablet Take 1 tablet (10 mg total) by mouth as needed. May repeat in 2 hours if needed 12 tablet 6   No current facility-administered medications on file prior to visit.    BP 108/70 mmHg  Pulse 94  Temp(Src) 98.1 F (36.7 C) (Oral)  Resp 20  Ht 5' 1.5" (1.562 m)  Wt 114 lb (51.71 kg)  BMI 21.19 kg/m2  SpO2 97%      Review of Systems  Constitutional: Negative.   HENT: Negative for congestion, dental problem, hearing loss, rhinorrhea, sinus pressure, sore throat and tinnitus.   Eyes: Negative for pain, discharge and visual disturbance.  Respiratory: Positive for cough and wheezing. Negative for shortness of breath.   Cardiovascular: Negative for chest pain, palpitations and leg swelling.  Gastrointestinal: Negative for nausea, vomiting, abdominal pain, diarrhea, constipation, blood in stool and abdominal distention.  Genitourinary: Negative for dysuria, urgency, frequency, hematuria, flank pain, vaginal bleeding, vaginal discharge, difficulty urinating, vaginal pain and pelvic pain.  Musculoskeletal: Negative for joint swelling, arthralgias and gait problem.  Skin: Negative for rash.  Neurological: Negative for dizziness, syncope, speech difficulty, weakness, numbness and headaches.  Hematological: Negative for adenopathy.  Psychiatric/Behavioral: Negative for behavioral problems, dysphoric mood and agitation. The patient is not nervous/anxious.        Objective:   Physical Exam  Constitutional: She is oriented to person, place, and time. She appears well-developed and well-nourished.  HENT:  Head: Normocephalic.  Right Ear: External ear normal.  Left Ear: External ear normal.  Mouth/Throat: Oropharynx is clear and moist.  Eyes: Conjunctivae and EOM are normal. Pupils are equal, round, and  reactive to light.  Neck: Normal range of motion. Neck supple. No thyromegaly present.  Cardiovascular: Normal rate, regular rhythm, normal heart sounds and intact distal pulses.   Pulmonary/Chest: Effort normal and breath sounds normal. No respiratory distress. She has no wheezes. She has no rales.  Abdominal: Soft. Bowel sounds are normal. She exhibits no mass. There is no tenderness.  Musculoskeletal: Normal range of motion.  Lymphadenopathy:    She has no cervical adenopathy.  Neurological: She is alert and oriented to person, place, and time.  Skin: Skin is warm and dry. No rash noted.  Psychiatric: She has a normal mood and affect. Her behavior is normal.          Assessment & Plan:   Viral URI with cough Hypertension treated with lisinopril Ongoing tobacco use  Total smoking cessation discussed and encouraged.  Will treat symptomatically.  She is aware that lisinopril may be an aggravating factor.  Will discontinue and maintain hydrochlorothiazide only Continue when necessary albuterol

## 2014-10-11 NOTE — Patient Instructions (Addendum)
Acute Bronchitis Bronchitis is inflammation of the airways that extend from the windpipe into the lungs (bronchi). The inflammation often causes mucus to develop. This leads to a cough, which is the most common symptom of bronchitis.  In acute bronchitis, the condition usually develops suddenly and goes away over time, usually in a couple weeks. Smoking, allergies, and asthma can make bronchitis worse. Repeated episodes of bronchitis may cause further lung problems.  CAUSES Acute bronchitis is most often caused by the same virus that causes a cold. The virus can spread from person to person (contagious) through coughing, sneezing, and touching contaminated objects. SIGNS AND SYMPTOMS   Cough.   Fever.   Coughing up mucus.   Body aches.   Chest congestion.   Chills.   Shortness of breath.   Sore throat.  DIAGNOSIS  Acute bronchitis is usually diagnosed through a physical exam. Your health care provider will also ask you questions about your medical history. Tests, such as chest X-rays, are sometimes done to rule out other conditions.  TREATMENT  Acute bronchitis usually goes away in a couple weeks. Oftentimes, no medical treatment is necessary. Medicines are sometimes given for relief of fever or cough. Antibiotic medicines are usually not needed but may be prescribed in certain situations. In some cases, an inhaler may be recommended to help reduce shortness of breath and control the cough. A cool mist vaporizer may also be used to help thin bronchial secretions and make it easier to clear the chest.  HOME CARE INSTRUCTIONS  Get plenty of rest.   Drink enough fluids to keep your urine clear or pale yellow (unless you have a medical condition that requires fluid restriction). Increasing fluids may help thin your respiratory secretions (sputum) and reduce chest congestion, and it will prevent dehydration.   Take medicines only as directed by your health care provider.  If  you were prescribed an antibiotic medicine, finish it all even if you start to feel better.  Avoid smoking and secondhand smoke. Exposure to cigarette smoke or irritating chemicals will make bronchitis worse. If you are a smoker, consider using nicotine gum or skin patches to help control withdrawal symptoms. Quitting smoking will help your lungs heal faster.   Reduce the chances of another bout of acute bronchitis by washing your hands frequently, avoiding people with cold symptoms, and trying not to touch your hands to your mouth, nose, or eyes.   Keep all follow-up visits as directed by your health care provider.  SEEK MEDICAL CARE IF: Your symptoms do not improve after 1 week of treatment.  SEEK IMMEDIATE MEDICAL CARE IF:  You develop an increased fever or chills.   You have chest pain.   You have severe shortness of breath.  You have bloody sputum.   You develop dehydration.  You faint or repeatedly feel like you are going to pass out.  You develop repeated vomiting.  You develop a severe headache.   Continue albuterol as needed  Smoking tobacco is very bad for your health. You should stop smoking immediately.  Discontinue lisinopril  Please check your blood pressure on a regular basis.  If it is consistently greater than 150/90, please make an office appointment.

## 2014-10-11 NOTE — Progress Notes (Signed)
Pre visit review using our clinic review tool, if applicable. No additional management support is needed unless otherwise documented below in the visit note. 

## 2014-11-08 ENCOUNTER — Encounter
Admission: RE | Admit: 2014-11-08 | Discharge: 2014-11-08 | Disposition: A | Discharge status: Home / Self Care | Payer: BLUE CROSS/BLUE SHIELD | Source: Ambulatory Visit | Attending: Obstetrics and Gynecology | Admitting: Obstetrics and Gynecology

## 2014-11-08 DIAGNOSIS — Z01810 Encounter for preprocedural cardiovascular examination: Secondary | ICD-10-CM | POA: Insufficient documentation

## 2014-11-08 DIAGNOSIS — Z01812 Encounter for preprocedural laboratory examination: Secondary | ICD-10-CM | POA: Insufficient documentation

## 2014-11-08 LAB — CBC
HCT: 44.1 % (ref 35.0–47.0)
HEMOGLOBIN: 14.5 g/dL (ref 12.0–16.0)
MCH: 30.3 pg (ref 26.0–34.0)
MCHC: 32.8 g/dL (ref 32.0–36.0)
MCV: 92.2 fL (ref 80.0–100.0)
Platelets: 152 10*3/uL (ref 150–440)
RBC: 4.79 MIL/uL (ref 3.80–5.20)
RDW: 14.9 % — AB (ref 11.5–14.5)
WBC: 13.6 10*3/uL — ABNORMAL HIGH (ref 3.6–11.0)

## 2014-11-08 NOTE — Pre-Procedure Instructions (Signed)
EKG from today's PAT visit has yet to be read by cardiologist, machine interpretation Sinus rhythm with marked sinus arrhythmia, prolong QT.  Prior EKG on 07/15/13 Sinus rhythm with rate variation. Called Dr. Wonda Cheng with above, ok to proceed, no need for clearance.

## 2014-11-08 NOTE — Patient Instructions (Signed)
  Your procedure is scheduled on: Friday November 12, 2014 Report to Same Day Surgery. To find out your arrival time please call [phone] between 1PM - 3PM on Thursday 28, 2016.  Remember: Instructions that are not followed completely may result in serious medical risk, up to and including death, or upon the discretion of your surgeon and anesthesiologist your surgery may need to be rescheduled.    _x___ 1. Do not eat food or drink liquids after midnight. No gum chewing or hard candies.     ____ 2. No Alcohol for 24 hours before or after surgery.   ____ 3. Bring all medications with you on the day of surgery if instructed.    __x__ 4. Notify your doctor if there is any change in your medical condition     (cold, fever, infections).     Do not wear jewelry, make-up, hairpins, clips or nail polish.  Do not wear lotions, powders, or perfumes. You may wear deodorant.  Do not shave 48 hours prior to surgery. Men may shave face and neck.  Do not bring valuables to the hospital.    [facility] is not responsible for any belongings or valuables.               Contacts, dentures or bridgework may not be worn into surgery.  Leave your suitcase in the car. After surgery it may be brought to your room.  For patients admitted to the hospital, discharge time is determined by your treatment team.   Patients discharged the day of surgery will not be allowed to drive home.    Please read over the following fact sheets that you were given:   [facility] Preparing for Surgery  ____ Take these medicines the morning of surgery with A SIP OF WATER: None    ____ Fleet Enema (as directed)   ____ Use CHG Soap as directed  ____ Use inhalers on the day of surgery  ____ Stop metformin 2 days prior to surgery    ____ Take 1/2 of usual insulin dose the night before surgery and none on the morning of surgery.   ____ Stop Coumadin/Plavix/aspirin on today.   ____ Stop Anti-inflammatories on  Tylenol OK  to take for pain.   ____ Stop supplements until after surgery.    ____ Bring C-Pap to the hospital.

## 2014-11-12 ENCOUNTER — Encounter: Payer: Self-pay | Admitting: *Deleted

## 2014-11-12 ENCOUNTER — Ambulatory Visit: Payer: BLUE CROSS/BLUE SHIELD | Admitting: Anesthesiology

## 2014-11-12 ENCOUNTER — Ambulatory Visit
Admission: RE | Admit: 2014-11-12 | Discharge: 2014-11-12 | Disposition: A | Discharge status: Home / Self Care | Payer: BLUE CROSS/BLUE SHIELD | Source: Ambulatory Visit | Attending: Obstetrics and Gynecology | Admitting: Obstetrics and Gynecology

## 2014-11-12 ENCOUNTER — Encounter
Admission: RE | Disposition: A | Discharge status: Home / Self Care | Payer: Self-pay | Source: Ambulatory Visit | Attending: Obstetrics and Gynecology

## 2014-11-12 DIAGNOSIS — Z79899 Other long term (current) drug therapy: Secondary | ICD-10-CM | POA: Insufficient documentation

## 2014-11-12 DIAGNOSIS — Z803 Family history of malignant neoplasm of breast: Secondary | ICD-10-CM | POA: Insufficient documentation

## 2014-11-12 DIAGNOSIS — N840 Polyp of corpus uteri: Secondary | ICD-10-CM | POA: Insufficient documentation

## 2014-11-12 DIAGNOSIS — Z9889 Other specified postprocedural states: Secondary | ICD-10-CM

## 2014-11-12 DIAGNOSIS — N711 Chronic inflammatory disease of uterus: Secondary | ICD-10-CM | POA: Diagnosis not present

## 2014-11-12 DIAGNOSIS — Z791 Long term (current) use of non-steroidal anti-inflammatories (NSAID): Secondary | ICD-10-CM | POA: Diagnosis not present

## 2014-11-12 DIAGNOSIS — N920 Excessive and frequent menstruation with regular cycle: Secondary | ICD-10-CM | POA: Insufficient documentation

## 2014-11-12 HISTORY — PX: DILATATION & CURETTAGE/HYSTEROSCOPY WITH MYOSURE: SHX6511

## 2014-11-12 LAB — POCT PREGNANCY, URINE: PREG TEST UR: NEGATIVE

## 2014-11-12 SURGERY — DILATATION & CURETTAGE/HYSTEROSCOPY WITH MYOSURE
Anesthesia: General

## 2014-11-12 MED ORDER — LACTATED RINGERS IV SOLN
INTRAVENOUS | Status: DC
  Administered 2014-11-12: 10:00:00 via INTRAVENOUS

## 2014-11-12 MED ORDER — ONDANSETRON HCL 4 MG/2ML IJ SOLN: 4.0000 mg | Freq: Once | INTRAMUSCULAR | Status: DC | PRN

## 2014-11-12 MED ORDER — SODIUM CHLORIDE 0.9 % IR SOLN
Status: DC | PRN
  Administered 2014-11-12: 900 mL

## 2014-11-12 MED ORDER — ONDANSETRON HCL 4 MG/2ML IJ SOLN
INTRAMUSCULAR | Status: DC | PRN
  Administered 2014-11-12: 4 mg via INTRAVENOUS

## 2014-11-12 MED ORDER — MIDAZOLAM HCL 5 MG/5ML IJ SOLN
INTRAMUSCULAR | Status: DC | PRN
  Administered 2014-11-12: 2 mg via INTRAVENOUS

## 2014-11-12 MED ORDER — FENTANYL CITRATE (PF) 100 MCG/2ML IJ SOLN
25.0000 ug | INTRAMUSCULAR | Status: DC | PRN
  Administered 2014-11-12 (×4): 25 ug via INTRAVENOUS

## 2014-11-12 MED ORDER — HYDROCODONE-ACETAMINOPHEN 5-325 MG PO TABS: 1.0000 | ORAL_TABLET | Freq: Four times a day (QID) | ORAL | Status: DC | PRN

## 2014-11-12 MED ORDER — EPHEDRINE SULFATE 50 MG/ML IJ SOLN
INTRAMUSCULAR | Status: DC | PRN
  Administered 2014-11-12: 10 mg via INTRAVENOUS

## 2014-11-12 MED ORDER — DEXAMETHASONE SODIUM PHOSPHATE 10 MG/ML IJ SOLN
INTRAMUSCULAR | Status: DC | PRN
  Administered 2014-11-12: 10 mg via INTRAVENOUS

## 2014-11-12 MED ORDER — FAMOTIDINE 20 MG PO TABS
ORAL_TABLET | ORAL | Status: AC
  Filled 2014-11-12: qty 1

## 2014-11-12 MED ORDER — LIDOCAINE HCL (CARDIAC) 20 MG/ML IV SOLN
INTRAVENOUS | Status: DC | PRN
  Administered 2014-11-12: 80 mg via INTRAVENOUS

## 2014-11-12 MED ORDER — FENTANYL CITRATE (PF) 100 MCG/2ML IJ SOLN
INTRAMUSCULAR | Status: AC
  Administered 2014-11-12: 25 ug via INTRAVENOUS
  Filled 2014-11-12: qty 2

## 2014-11-12 MED ORDER — PROPOFOL 10 MG/ML IV BOLUS
INTRAVENOUS | Status: DC | PRN
  Administered 2014-11-12: 150 mg via INTRAVENOUS

## 2014-11-12 MED ORDER — FENTANYL CITRATE (PF) 100 MCG/2ML IJ SOLN
INTRAMUSCULAR | Status: DC | PRN
  Administered 2014-11-12: 50 ug via INTRAVENOUS

## 2014-11-12 MED ORDER — FAMOTIDINE 20 MG PO TABS
20.0000 mg | ORAL_TABLET | Freq: Once | ORAL | Status: AC
  Administered 2014-11-12: 20 mg via ORAL

## 2014-11-12 SURGICAL SUPPLY — 20 items
ABLATOR ENDOMETRIAL MYOSURE (ABLATOR) ×1 IMPLANT
CANISTER SUC SOCK COL 7IN (MISCELLANEOUS) ×3 IMPLANT
CATH ROBINSON RED A/P 16FR (CATHETERS) ×3 IMPLANT
GLOVE BIO SURGEON STRL SZ7 (GLOVE) ×9 IMPLANT
GOWN STRL REUS W/ TWL LRG LVL3 (GOWN DISPOSABLE) ×2 IMPLANT
GOWN STRL REUS W/TWL LRG LVL3 (GOWN DISPOSABLE) ×6
KIT RM TURNOVER CYSTO AR (KITS) ×3 IMPLANT
MYOSURE LITE POLYP REMOVAL (MISCELLANEOUS) IMPLANT
PACK DNC HYST (MISCELLANEOUS) ×3 IMPLANT
PAD GROUND ADULT SPLIT (MISCELLANEOUS) ×1 IMPLANT
PAD OB MATERNITY 4.3X12.25 (PERSONAL CARE ITEMS) ×3 IMPLANT
PAD PREP 24X41 OB/GYN DISP (PERSONAL CARE ITEMS) ×3 IMPLANT
SEAL ROD LENS SCOPE MYOSURE (ABLATOR) ×3 IMPLANT
SOL .9 NS 3000ML IRR  AL (IV SOLUTION) ×2
SOL .9 NS 3000ML IRR AL (IV SOLUTION) ×1
SOL .9 NS 3000ML IRR UROMATIC (IV SOLUTION) ×2 IMPLANT
TOWEL OR 17X26 4PK STRL BLUE (TOWEL DISPOSABLE) ×3 IMPLANT
TUBING CONNECTING 10 (TUBING) ×2 IMPLANT
TUBING CONNECTING 10' (TUBING) ×1
TUBING HYSTEROSCOPY DOLPHIN (MISCELLANEOUS) ×3 IMPLANT

## 2014-11-12 NOTE — H&P (Signed)
Paper H&P reviewed:  History reviewed, patient examined, no change in status, stable for surgery.

## 2014-11-12 NOTE — Transfer of Care (Signed)
Immediate Anesthesia Transfer of Care Note  Patient: Kayla Mcintosh  Procedure(s) Performed: Procedure(s): DILATATION & CURETTAGE/HYSTEROSCOPY WITH MYOSURE (N/A)  Patient Location: PACU  Anesthesia Type:General  Level of Consciousness: awake, alert  and oriented  Airway & Oxygen Therapy: Patient Spontanous Breathing and Patient connected to face mask oxygen  Post-op Assessment: Report given to RN and Post -op Vital signs reviewed and stable  Post vital signs: Reviewed and stable  Last Vitals:  Filed Vitals:   11/12/14 1102  BP: 122/85  Pulse: 84  Temp: 36 C  Resp: 12    Complications: No apparent anesthesia complications

## 2014-11-12 NOTE — Anesthesia Procedure Notes (Signed)
Procedure Name: LMA Insertion Date/Time: 11/12/2014 10:18 AM Performed by: Delaney Meigs Pre-anesthesia Checklist: Patient identified, Emergency Drugs available, Suction available, Patient being monitored and Timeout performed Patient Re-evaluated:Patient Re-evaluated prior to inductionOxygen Delivery Method: Circle system utilized and Simple face mask Preoxygenation: Pre-oxygenation with 100% oxygen Intubation Type: IV induction Ventilation: Mask ventilation without difficulty LMA Size: 3.5 Number of attempts: 1 Placement Confirmation: breath sounds checked- equal and bilateral and positive ETCO2 Tube secured with: Tape

## 2014-11-12 NOTE — Anesthesia Preprocedure Evaluation (Addendum)
Anesthesia Evaluation  Patient identified by MRN, date of birth, ID band Patient awake    Reviewed: Allergy & Precautions, NPO status , Patient's Chart, lab work & pertinent test results  Airway Mallampati: II  TM Distance: >3 FB Neck ROM: Full    Dental  (+) Teeth Intact   Pulmonary COPD (last inhaler 2 weeks ago) COPD inhaler, Current Smoker (1 1/2 ppd),          Cardiovascular hypertension, Pt. on medications     Neuro/Psych    GI/Hepatic GERD- (Tums only)  ,  Endo/Other    Renal/GU      Musculoskeletal   Abdominal   Peds  Hematology   Anesthesia Other Findings   Reproductive/Obstetrics                           Anesthesia Physical Anesthesia Plan  ASA: III  Anesthesia Plan: General   Post-op Pain Management:    Induction: Intravenous  Airway Management Planned: LMA  Additional Equipment:   Intra-op Plan:   Post-operative Plan:   Informed Consent: I have reviewed the patients History and Physical, chart, labs and discussed the procedure including the risks, benefits and alternatives for the proposed anesthesia with the patient or authorized representative who has indicated his/her understanding and acceptance.     Plan Discussed with:   Anesthesia Plan Comments:         Anesthesia Quick Evaluation

## 2014-11-12 NOTE — Anesthesia Postprocedure Evaluation (Signed)
  Anesthesia Post-op Note  Patient: Kayla Mcintosh  Procedure(s) Performed: Procedure(s): DILATATION & CURETTAGE/HYSTEROSCOPY WITH MYOSURE (N/A)  Anesthesia type:General  Patient location: PACU  Post pain: Pain level controlled  Post assessment: Post-op Vital signs reviewed, Patient's Cardiovascular Status Stable, Respiratory Function Stable, Patent Airway and No signs of Nausea or vomiting  Post vital signs: Reviewed and stable  Last Vitals:  Filed Vitals:   11/12/14 1102  BP: 122/85  Pulse: 84  Temp: 36 C  Resp: 12    Level of consciousness: awake, alert  and patient cooperative  Complications: No apparent anesthesia complications

## 2014-11-12 NOTE — Op Note (Signed)
Patient Name: Kayla Mcintosh Date of Procedure: 11/12/2014  Preoperative Diagnosis: 1) 44 y.o. with menorrhagia 2) Ultrasound suggestive of endometrial polyps  Postoperative Diagnosis: 1) same  Operation Performed: Hysteroscopy, dilation and curettage  Indication: Menorrhagia and abnormal transvaginal ultrasound  Anesthesia: .Choice  Primary Surgeon: Malachy Mood, MD  Assistant: none  Preoperative Antibiotics: none  Estimated Blood Loss: minimal  IV Fluids: 48mL  Urine Output:: ~63mL straiggt cath  Drains or Tubes: none  Implants: none  Specimens Removed: endometrial curettings  Complications: none  Intraoperative Findings:  Normal cervix, cervical canal. Uterine cavity grossly normal.  Some small polyp around the right tubal ostia.  Patient Condition: stable  Procedure in Detail:  Patient was taken to the operating room were she was administered general endotracheal anesthesia.  She was positioned in the dorsal lithotomy position utilizing Allen stirups, prepped and draped in the usual sterile fashion.  Uterus was noted to be normal in size, anteverted.   Prior to proceeding with the case a time out was performed.  Attention was turned to the patient's pelvis.  A red rubber catheter was used to empty the patient's bladder.  An operative speculum was placed to allow visualization of the cervix.  The anterior lip of the cervix was grasped with a single tooth tenaculum and the cervix was sequentially dilated using pratt dilators.  The hysteroscope was then advanced into the uterine cavity noting the above findings.  The polyps as well as a random sampling of endometrium collected using the Myosure system and sent to pathology.    The single tooth tenaculum was removed from the cervix.  The tenaculum sites and cervix were noted to be  Hemostatic before removing the operative speculum.  Sponge needle and instrument counts were corrects times two.  The patient tolerated  the procedure well and was taken to the recovery room in stable condition.

## 2014-11-12 NOTE — Discharge Instructions (Addendum)

## 2014-11-15 LAB — SURGICAL PATHOLOGY

## 2014-12-22 ENCOUNTER — Telehealth: Payer: Self-pay

## 2014-12-22 MED ORDER — SCOPOLAMINE 1 MG/3DAYS TD PT72: 1.0000 | MEDICATED_PATCH | TRANSDERMAL | Status: DC

## 2014-12-22 NOTE — Telephone Encounter (Signed)
I would suggest scopolamine patch-1 patch behind ear every 72 hours #9 with no refill.  Dramamine tablets are also an option but these are over-the-counter

## 2014-12-22 NOTE — Telephone Encounter (Signed)
Rx sent to pharmacy   

## 2014-12-22 NOTE — Telephone Encounter (Signed)
The pt called stating she is going on a cruise.  She is hoping to get an rx of dramaine (spelling?) called into the pharmacy ([facility] in [place])

## 2015-10-27 ENCOUNTER — Other Ambulatory Visit: Payer: Self-pay | Admitting: Family Medicine

## 2015-10-27 MED ORDER — HYDROCHLOROTHIAZIDE 12.5 MG PO CAPS: 12.5000 mg | ORAL_CAPSULE | Freq: Every day | ORAL | Status: DC

## 2015-10-27 NOTE — Telephone Encounter (Signed)
Pt request refill  hydrochlorothiazide (MICROZIDE) 12.5 MG capsule  Pt will be out tomorrow and and needs asap. Dr Elease Hashimoto will not be back until Monday. Pt has appt on 7/21 for a follow up.  [facility]/ [address]/[place]

## 2015-10-27 NOTE — Telephone Encounter (Signed)
Rx done. 

## 2015-11-04 ENCOUNTER — Ambulatory Visit (INDEPENDENT_AMBULATORY_CARE_PROVIDER_SITE_OTHER): Payer: BLUE CROSS/BLUE SHIELD | Admitting: Family Medicine

## 2015-11-04 VITALS — BP 100/70 | HR 83 | Temp 98.3°F | Ht 62.0 in | Wt 121.0 lb

## 2015-11-04 DIAGNOSIS — B351 Tinea unguium: Secondary | ICD-10-CM

## 2015-11-04 DIAGNOSIS — I10 Essential (primary) hypertension: Secondary | ICD-10-CM

## 2015-11-04 DIAGNOSIS — E785 Hyperlipidemia, unspecified: Secondary | ICD-10-CM | POA: Diagnosis not present

## 2015-11-04 LAB — HEPATIC FUNCTION PANEL
ALBUMIN: 4.2 g/dL (ref 3.5–5.2)
ALK PHOS: 60 U/L (ref 39–117)
ALT: 15 U/L (ref 0–35)
AST: 15 U/L (ref 0–37)
Bilirubin, Direct: 0.1 mg/dL (ref 0.0–0.3)
Total Bilirubin: 0.5 mg/dL (ref 0.2–1.2)
Total Protein: 7.1 g/dL (ref 6.0–8.3)

## 2015-11-04 LAB — BASIC METABOLIC PANEL
BUN: 15 mg/dL (ref 6–23)
CALCIUM: 9.7 mg/dL (ref 8.4–10.5)
CO2: 33 mEq/L — ABNORMAL HIGH (ref 19–32)
Chloride: 101 mEq/L (ref 96–112)
Creatinine, Ser: 0.88 mg/dL (ref 0.40–1.20)
GFR: 74.58 mL/min (ref >60.00)
GLUCOSE: 84 mg/dL (ref 70–99)
Potassium: 3.7 mEq/L (ref 3.5–5.1)
Sodium: 139 mEq/L (ref 135–145)

## 2015-11-04 MED ORDER — NAPROXEN 500 MG PO TABS: 500.0000 mg | ORAL_TABLET | Freq: Three times a day (TID) | ORAL | Status: DC | PRN

## 2015-11-04 MED ORDER — RIZATRIPTAN BENZOATE 10 MG PO TABS: 10.0000 mg | ORAL_TABLET | ORAL | Status: DC | PRN

## 2015-11-04 MED ORDER — PROMETHAZINE HCL 25 MG PO TABS: ORAL_TABLET | ORAL | Status: DC

## 2015-11-04 MED ORDER — HYDROCHLOROTHIAZIDE 12.5 MG PO CAPS: 12.5000 mg | ORAL_CAPSULE | Freq: Every day | ORAL | Status: DC

## 2015-11-04 MED ORDER — TERBINAFINE HCL 250 MG PO TABS: 250.0000 mg | ORAL_TABLET | Freq: Every day | ORAL | Status: DC

## 2015-11-04 MED ORDER — CYCLOBENZAPRINE HCL 5 MG PO TABS: 5.0000 mg | ORAL_TABLET | Freq: Every evening | ORAL | Status: DC | PRN

## 2015-11-04 NOTE — Progress Notes (Signed)
Subjective:     Patient ID: Kayla Mcintosh, female   DOB: March 06, 1971, 45 y.o.   MRN: [id number]  HPI Here for follow-up several items  Hypertension which has been controlled with HCTZ. She's been very active with exercising several days per week and feels well overall.  She has intermittent migraine headaches and requesting refills of Maxalt and Phenergan which she uses as needed. Migraines have been relatively stable. She has intermittent back pain and hip pain and has done well with combination of naproxen and Flexeril in the past and requesting refills.  She has some thickening and brittle changes of several toenails. She is concerned she's had fungal infection for years. Would like to explore oral options No hepatic issues. She has low-grade chronic leukocytosis which is followed at [facility] and has been stable  Past Medical History  Diagnosis Date  . HYPERLIPIDEMIA 11/08/2008  . LEUKOCYTOSIS UNSPECIFIED 11/08/2008    pt normally runs WBC 17,000-30,000  . HYPERTENSION 11/08/2008  . MIGRAINE HEADACHE 04/28/2010   Past Surgical History  Procedure Laterality Date  . Tubal ligation  2009  . Dilatation & curettage/hysteroscopy with myosure N/A 11/12/2014    Procedure: DILATATION & CURETTAGE/HYSTEROSCOPY WITH MYOSURE;  Surgeon: Malachy Mood, MD;  Location: [facility] ORS;  Service: Gynecology;  Laterality: N/A;    reports that she has been smoking Cigarettes.  She has a 20 pack-year smoking history. She does not have any smokeless tobacco history on file. She reports that she does not drink alcohol or use illicit drugs. family history includes Alcohol abuse in her other; Cancer in her other; Heart disease in her other; Hypertension in her other. No Known Allergies   Review of Systems  Constitutional: Negative for fatigue.  Eyes: Negative for visual disturbance.  Respiratory: Negative for cough, chest tightness, shortness of breath and wheezing.   Cardiovascular: Negative for chest  pain, palpitations and leg swelling.  Genitourinary: Negative for dysuria.  Neurological: Negative for dizziness, seizures, syncope, weakness, light-headedness and headaches.       Objective:   Physical Exam  Constitutional: She appears well-developed and well-nourished.  HENT:  Right Ear: External ear normal.  Left Ear: External ear normal.  Neck: Neck supple. No thyromegaly present.  Cardiovascular: Normal rate and regular rhythm.   Pulmonary/Chest: Effort normal and breath sounds normal. No respiratory distress. She has no wheezes. She has no rales.  Abdominal: Soft. Bowel sounds are normal. She exhibits no distension and no mass. There is no tenderness. There is no rebound and no guarding.  Musculoskeletal: She exhibits no edema.  Skin:  She has some thickening and brittle changes of several toenails       Assessment:     #1 hypertension. Stable and at goal  #2 probable onychomycosis involving several toenails  #3 history of migraine headaches  #4 history of nicotine use    Plan:     -Refill all of her regular medications -Check basic metabolic panel and hepatic panel -If hepatic panel normal, start Lamisil 250 mg once daily for 3 months -encouraged to stop smoking.  [name] MD [facility] at [facility]

## 2015-11-04 NOTE — Progress Notes (Signed)
Pre visit review using our clinic review tool, if applicable. No additional management support is needed unless otherwise documented below in the visit note. 

## 2015-11-09 ENCOUNTER — Other Ambulatory Visit: Payer: Self-pay

## 2015-11-09 MED ORDER — PROMETHAZINE HCL 25 MG PO TABS: ORAL_TABLET | ORAL | 2 refills | Status: DC

## 2015-12-09 ENCOUNTER — Ambulatory Visit (INDEPENDENT_AMBULATORY_CARE_PROVIDER_SITE_OTHER): Payer: BLUE CROSS/BLUE SHIELD | Admitting: Family Medicine

## 2015-12-09 ENCOUNTER — Encounter: Payer: Self-pay | Admitting: Family Medicine

## 2015-12-09 VITALS — BP 104/82 | HR 90 | Temp 98.8°F | Ht 62.0 in | Wt 116.0 lb

## 2015-12-09 DIAGNOSIS — R432 Parageusia: Secondary | ICD-10-CM | POA: Diagnosis not present

## 2015-12-09 DIAGNOSIS — Z79899 Other long term (current) drug therapy: Secondary | ICD-10-CM | POA: Diagnosis not present

## 2015-12-09 NOTE — Progress Notes (Signed)
Pre visit review using our clinic review tool, if applicable. No additional management support is needed unless otherwise documented below in the visit note. 

## 2015-12-09 NOTE — Progress Notes (Signed)
Subjective:     Patient ID: Kayla Mcintosh, female   DOB: 1970-12-27, 45 y.o.   MRN: [id number]  HPI Patient seen with loss of taste. She first noticed this last weekend around Saturday. She has lost taste to basically anything including sweets, salty, or bitter. She has preserved smell. No congestion or sinusitis symptoms. She's got on the computer and read several blogs that described loss of taste with Lamisil-which she just started last month. She denies any prior history of loss of taste. She's not had any facial weakness or other cranial nerve deficits.  She is very distressed whether she may have some sort of liver abnormality because of her loss of taste. She had totally normal liver transaminases prior to starting Lamisil back in July. No prior history of liver difficulties.  Past Medical History:  Diagnosis Date  . HYPERLIPIDEMIA 11/08/2008  . HYPERTENSION 11/08/2008  . LEUKOCYTOSIS UNSPECIFIED 11/08/2008   pt normally runs WBC 17,000-30,000  . MIGRAINE HEADACHE 04/28/2010   Past Surgical History:  Procedure Laterality Date  . DILATATION & CURETTAGE/HYSTEROSCOPY WITH MYOSURE N/A 11/12/2014   Procedure: DILATATION & CURETTAGE/HYSTEROSCOPY WITH MYOSURE;  Surgeon: Malachy Mood, MD;  Location: [facility] ORS;  Service: Gynecology;  Laterality: N/A;  . TUBAL LIGATION  2009    reports that she has been smoking Cigarettes.  She has a 20.00 pack-year smoking history. She does not have any smokeless tobacco history on file. She reports that she does not drink alcohol or use drugs. family history includes Alcohol abuse in her other; Cancer in her other; Heart disease in her other; Hypertension in her other. No Known Allergies   Review of Systems  Constitutional: Negative for appetite change and unexpected weight change.  HENT: Negative for congestion, mouth sores and sore throat.        Objective:   Physical Exam  Constitutional: She appears well-developed and well-nourished.  HENT:   Mouth/Throat: Oropharynx is clear and moist.  Cardiovascular: Normal rate and regular rhythm.        Assessment:     Subjective loss of taste. Patient recently on Lamisil and this is associated sometimes with loss of taste or smell  Patient was extremely agitated during encounter. We did our best to try to calm her- to no avail.    Plan:     -Patient requesting hepatic panel and this was ordered   [name] MD [facility] at [facility]

## 2015-12-10 LAB — HEPATIC FUNCTION PANEL
ALT: 10 U/L (ref 6–29)
AST: 10 U/L (ref 10–30)
Albumin: 4.4 g/dL (ref 3.6–5.1)
Alkaline Phosphatase: 67 U/L (ref 33–115)
BILIRUBIN DIRECT: 0.1 mg/dL (ref <=0.2)
BILIRUBIN INDIRECT: 0.5 mg/dL (ref 0.2–1.2)
TOTAL PROTEIN: 7.2 g/dL (ref 6.1–8.1)
Total Bilirubin: 0.6 mg/dL (ref 0.2–1.2)

## 2016-03-12 ENCOUNTER — Emergency Department
Admission: EM | Admit: 2016-03-12 | Discharge: 2016-03-12 | Discharge status: Against Medical Advice | Payer: BLUE CROSS/BLUE SHIELD | Attending: Emergency Medicine | Admitting: Emergency Medicine

## 2016-03-12 ENCOUNTER — Encounter: Payer: Self-pay | Admitting: Emergency Medicine

## 2016-03-12 ENCOUNTER — Emergency Department: Payer: BLUE CROSS/BLUE SHIELD

## 2016-03-12 DIAGNOSIS — F17210 Nicotine dependence, cigarettes, uncomplicated: Secondary | ICD-10-CM | POA: Diagnosis not present

## 2016-03-12 DIAGNOSIS — R51 Headache: Secondary | ICD-10-CM | POA: Insufficient documentation

## 2016-03-12 DIAGNOSIS — Z79899 Other long term (current) drug therapy: Secondary | ICD-10-CM | POA: Insufficient documentation

## 2016-03-12 DIAGNOSIS — Z791 Long term (current) use of non-steroidal anti-inflammatories (NSAID): Secondary | ICD-10-CM | POA: Insufficient documentation

## 2016-03-12 DIAGNOSIS — I10 Essential (primary) hypertension: Secondary | ICD-10-CM | POA: Insufficient documentation

## 2016-03-12 DIAGNOSIS — R270 Ataxia, unspecified: Secondary | ICD-10-CM | POA: Insufficient documentation

## 2016-03-12 LAB — CBC
HCT: 48.7 % — ABNORMAL HIGH (ref 35.0–47.0)
Hemoglobin: 16.7 g/dL — ABNORMAL HIGH (ref 12.0–16.0)
MCH: 31.1 pg (ref 26.0–34.0)
MCHC: 34.3 g/dL (ref 32.0–36.0)
MCV: 90.8 fL (ref 80.0–100.0)
PLATELETS: 170 10*3/uL (ref 150–440)
RBC: 5.36 MIL/uL — AB (ref 3.80–5.20)
RDW: 14.4 % (ref 11.5–14.5)
WBC: 12.4 10*3/uL — ABNORMAL HIGH (ref 3.6–11.0)

## 2016-03-12 LAB — URINALYSIS COMPLETE WITH MICROSCOPIC (ARMC ONLY)
BILIRUBIN URINE: NEGATIVE
Bacteria, UA: NONE SEEN
Glucose, UA: NEGATIVE mg/dL
Ketones, ur: NEGATIVE mg/dL
Leukocytes, UA: NEGATIVE
Nitrite: NEGATIVE
PH: 5 (ref 5.0–8.0)
PROTEIN: NEGATIVE mg/dL
Specific Gravity, Urine: 1.023 (ref 1.005–1.030)

## 2016-03-12 LAB — BASIC METABOLIC PANEL
Anion gap: 7 (ref 5–15)
BUN: 20 mg/dL (ref 6–20)
CHLORIDE: 101 mmol/L (ref 101–111)
CO2: 29 mmol/L (ref 22–32)
CREATININE: 0.97 mg/dL (ref 0.44–1.00)
Calcium: 9.4 mg/dL (ref 8.9–10.3)
GFR calc non Af Amer: >60 mL/min (ref >60)
GLUCOSE: 103 mg/dL — AB (ref 65–99)
Potassium: 4.3 mmol/L (ref 3.5–5.1)
Sodium: 137 mmol/L (ref 135–145)

## 2016-03-12 LAB — TROPONIN I: Troponin I: <0.03 ng/mL (ref <0.03)

## 2016-03-12 LAB — POCT PREGNANCY, URINE: PREG TEST UR: NEGATIVE

## 2016-03-12 MED ORDER — PROCHLORPERAZINE EDISYLATE 5 MG/ML IJ SOLN
10.0000 mg | Freq: Once | INTRAMUSCULAR | Status: AC
  Administered 2016-03-12: 10 mg via INTRAVENOUS
  Filled 2016-03-12: qty 2

## 2016-03-12 MED ORDER — MECLIZINE HCL 25 MG PO TABS
25.0000 mg | ORAL_TABLET | Freq: Once | ORAL | Status: AC
  Administered 2016-03-12: 25 mg via ORAL
  Filled 2016-03-12: qty 1

## 2016-03-12 MED ORDER — SODIUM CHLORIDE 0.9 % IV BOLUS (SEPSIS)
1000.0000 mL | Freq: Once | INTRAVENOUS | Status: AC
  Administered 2016-03-12: 1000 mL via INTRAVENOUS

## 2016-03-12 NOTE — ED Notes (Signed)
Pt called out demanding iv to be removed after dr Clearnce Hasten saw pt.   Iv removed stat.  Pt states im leaving now.  Pt angry.  Pt was fine earlier.  Pt ambulated without diff out of er.  Dr Clearnce Hasten aware.  Pt left without d/c inst

## 2016-03-12 NOTE — ED Notes (Signed)
Resumed care from jerrie rn.  Pt alert.  nsr on monitor.  Skin warm and dry.   Iv infusing.  Pt watching tv.

## 2016-03-12 NOTE — ED Triage Notes (Signed)
Patient reports having extreme fatigue last few days. States "It feels like I have the flu, but without the cold symptoms.". Patient states she has had "shaking muscles", cold chills, nausea. +Headache last few days. No pain anywhere currently.

## 2016-03-12 NOTE — ED Provider Notes (Signed)
[facility] Emergency Department Provider Note  ____________________________________________   First MD Initiated Contact with Patient 03/12/16 1235     (approximate)  I have reviewed the triage vital signs and the nursing notes.   HISTORY  Chief Complaint Weakness and Headache   HPI Kayla Mcintosh is a 45 y.o. female with a history of hyperlipidemia as well as hypertension and leukocytosis was presenting to the emergency department today with 5-6 days of bodyaches as well as ataxia. She says that she feels that her torso is "moving all over the place" when she stands up. Says that she has had body aches as well as nausea and a mild headache to the parietal region bilaterally. She has been taking her home Phenergan as well as Maxalt with relief of symptoms. She says that she no longer has body aches only has a dull headache at this time. However, the ataxia has persisted and she came to the emergency department because of concern for this. She denies any pressure ringing in her ears. She says specifically that she was concerned for stroke. Otherwise, she denies any weakness or numbness.   Past Medical History:  Diagnosis Date  . HYPERLIPIDEMIA 11/08/2008  . HYPERTENSION 11/08/2008  . LEUKOCYTOSIS UNSPECIFIED 11/08/2008   pt normally runs WBC 17,000-30,000  . MIGRAINE HEADACHE 04/28/2010    Patient Active Problem List   Diagnosis Date Noted  . Migraine 04/28/2010  . ABSCESS, SKIN 09/21/2009  . Hyperlipidemia 11/08/2008  . LEUKOCYTOSIS UNSPECIFIED 11/08/2008  . Essential hypertension 11/08/2008    Past Surgical History:  Procedure Laterality Date  . DILATATION & CURETTAGE/HYSTEROSCOPY WITH MYOSURE N/A 11/12/2014   Procedure: DILATATION & CURETTAGE/HYSTEROSCOPY WITH MYOSURE;  Surgeon: Malachy Mood, MD;  Location: [facility] ORS;  Service: Gynecology;  Laterality: N/A;  . TUBAL LIGATION  2009    Prior to Admission medications   Medication Sig Start Date  End Date Taking? Authorizing Provider  albuterol (PROAIR HFA) 108 (90 BASE) MCG/ACT inhaler Inhale 2 puffs into the lungs every 6 (six) hours as needed for wheezing or shortness of breath.    Historical Provider, MD  cyclobenzaprine (FLEXERIL) 5 MG tablet Take 1 tablet (5 mg total) by mouth at bedtime as needed. 11/04/15   [name], MD  hydrochlorothiazide (MICROZIDE) 12.5 MG capsule Take 1 capsule (12.5 mg total) by mouth daily. 11/04/15   [name], MD  levonorgestrel (MIRENA) 20 MCG/24HR IUD 1 each by Intrauterine route once. Placed 12/2014    Historical Provider, MD  naproxen (NAPROSYN) 500 MG tablet Take 1 tablet (500 mg total) by mouth 3 (three) times daily as needed. 11/04/15   [name], MD  promethazine (PHENERGAN) 25 MG tablet Take one tablet by mouth every 6 hours as needed for headaches. 11/09/15   [name], MD  rizatriptan (MAXALT) 10 MG tablet Take 1 tablet (10 mg total) by mouth as needed. May repeat in 2 hours if needed 11/04/15   [name], MD    Allergies Patient has no known allergies.  Family History  Problem Relation Age of Onset  . Heart disease Other   . Cancer Other     breast grandmother, lung  . Alcohol abuse Other     parent, blood relative  . Hypertension Other     Social History Social History  Substance Use Topics  . Smoking status: Current Every Day Smoker    Packs/day: 1.00    Years: 20.00    Types: Cigarettes  .  Smokeless tobacco: Never Used  . Alcohol use No    Review of Systems Constitutional: No fever/chills Eyes: No visual changes. ENT: No sore throat. Cardiovascular: Denies chest pain. Respiratory: Denies shortness of breath. Gastrointestinal: No abdominal pain.  no vomiting.  No diarrhea.  No constipation. Genitourinary: Negative for dysuria. Musculoskeletal: Negative for back pain. Skin: Negative for rash. Neurological: Negative for headaches, focal weakness or numbness.  10-point ROS otherwise  negative.  ____________________________________________   PHYSICAL EXAM:  VITAL SIGNS: ED Triage Vitals  Enc Vitals Group     BP 03/12/16 1106 (!) 137/107     Pulse Rate 03/12/16 1106 (!) 106     Resp 03/12/16 1106 18     Temp 03/12/16 1106 98.3 F (36.8 C)     Temp Source 03/12/16 1106 Oral     SpO2 03/12/16 1106 98 %     Weight 03/12/16 1107 115 lb (52.2 kg)     Height 03/12/16 1107 5\' 2"  (1.575 m)     Head Circumference --      Peak Flow --      Pain Score 03/12/16 1107 0     Pain Loc --      Pain Edu? --      Excl. in [place]? --     Constitutional: Alert and oriented. Well appearing and in no acute distress. Eyes: Conjunctivae are normal. PERRL. EOMI. Head: Atraumatic.Normal TMs bilaterally. Nose: No congestion/rhinnorhea. Mouth/Throat: Mucous membranes are moist.   Neck: No stridor.   Cardiovascular: Tachycardic, regular rhythm. Grossly normal heart sounds.   Respiratory: Normal respiratory effort.  No retractions. Lungs CTAB. Gastrointestinal: Soft and nontender. No distention.  Musculoskeletal: No lower extremity tenderness nor edema.  No joint effusions. Neurologic:  Normal speech and language. No gross focal neurologic deficits are appreciated. Ataxic gait. No nystagmus. Skin:  Skin is warm, dry and intact. No rash noted. Psychiatric: Mood and affect are normal. Speech and behavior are normal.  ____________________________________________   LABS (all labs ordered are listed, but only abnormal results are displayed)  Labs Reviewed  BASIC METABOLIC PANEL - Abnormal; Notable for the following:       Result Value   Glucose, Bld 103 (*)    All other components within normal limits  CBC - Abnormal; Notable for the following:    WBC 12.4 (*)    RBC 5.36 (*)    Hemoglobin 16.7 (*)    HCT 48.7 (*)    All other components within normal limits  URINALYSIS COMPLETEWITH MICROSCOPIC ([facility] ONLY) - Abnormal; Notable for the following:    Color, Urine YELLOW (*)     APPearance CLEAR (*)    Hgb urine dipstick 1+ (*)    Squamous Epithelial / LPF 0-5 (*)    All other components within normal limits  TROPONIN I  CBG MONITORING, ED  POC URINE PREG, ED  POCT PREGNANCY, URINE   ____________________________________________  EKG  ED ECG REPORT I, Nizar Cutler,  Youlanda Roys, the attending physician, personally viewed and interpreted this ECG.   Date: 03/12/2016  EKG Time: 1113  Rate: 102  Rhythm: sinus tachycardia  Axis: Normal axis  Intervals:none  ST&T Change: No ST segment elevation or depression. T-wave inversions in 3 as well as aVF. T wave morphology appears different from previous. ____________________________________________  RADIOLOGY  CT Head Wo Contrast (Accession [id number]) (Order [id number])  Imaging  Date: 03/12/2016 Department: [facility] EMERGENCY DEPARTMENT Released By/Authorizing: Orbie Pyo, MD (auto-released)  Exam Information   Status  Exam Begun  Exam Ended   Final [99] 03/12/2016 2:20 PM 03/12/2016 2:28 PM  PACS Images   Show images for CT Head Wo Contrast  Study Result   CLINICAL DATA:  Truncal ataxia with headache and fatigue  EXAM: CT HEAD WITHOUT CONTRAST  TECHNIQUE: Contiguous axial images were obtained from the base of the skull through the vertex without intravenous contrast.  COMPARISON:  None.  FINDINGS: Brain: The ventricles are normal in size and configuration. There is no intracranial mass, hemorrhage, extra-axial fluid collection or midline shift. Gray-white compartments appear normal. No acute infarct evident.  Vascular: No hyperdense vessel evident. No vascular calcifications are appreciable.  Skull: The bony calvarium appears intact.  Sinuses/Orbits: Visualized paranasal sinuses are clear. Visualized orbits appear symmetric bilaterally.  Other: Mastoid air cells are clear.  IMPRESSION: Study within normal limits.   Electronically Signed    By: Lowella Grip III M.D.   On: 03/12/2016 14:35    ____________________________________________   PROCEDURES  Procedure(s) performed:   Procedures  Critical Care performed:   ____________________________________________   INITIAL IMPRESSION / ASSESSMENT AND PLAN / ED COURSE  Pertinent labs & imaging results that were available during my care of the patient were reviewed by me and considered in my medical decision making (see chart for details).  ----------------------------------------- 3:50 PM on 03/12/2016 -----------------------------------------  Report from nurse the patient became agitated and then left suddenly. I was not able to counsel the patient will give her verbal discharge instructions. Unclear if her ataxia had improved subjectively. However, per nurse Cathie Hoops, the patient ambulated out of the emergency department with a normal, non-ataxic gait. Also with reassuring CT after multiple days of symptoms. Less likely to be CVA.    Clinical Course      ____________________________________________   FINAL CLINICAL IMPRESSION(S) / ED DIAGNOSES  Ataxia   NEW MEDICATIONS STARTED DURING THIS VISIT:  Discharge Medication List as of 03/12/2016  3:50 PM       Note:  This document was prepared using Dragon voice recognition software and may include unintentional dictation errors.    Orbie Pyo, MD 03/12/16 [phone]

## 2016-03-12 NOTE — ED Notes (Signed)
Patient transported to CT 

## 2016-03-14 NOTE — Progress Notes (Addendum)
HPI:  Kayla Mcintosh is a 45 yo F with PMH HTN, Migraines, leukocytosis, hyperlipidemia here for an acute visit for dizziness. She reports the symptoms started about 1 week ago and include 4 migraines (frontal) in 1 week, nausea, vomiting, feeling off balance, dizziness, tingling "throughout whole body" at times, foggy headed, chills, hot and cold, eye twitching at times, lack of appetite. Headache better today, dull, milder. She reports she usually uses maxalt 1x per month, but used 4 times in last week. She has not been drinking. She had been doing a new boot camp. She quit caffiene (many cups of coffee per day) 1 week ago. She smokes weed daily and 1.5 packs of cigarettes daily. She feels the phenergan is not very helpful. Denies fevers, vision changes, weakness, abd pain, diarrhea, melena, hematochezia, worst headache of life, travel, rash, tick bite or exposure, sick contacts, new medications or drugs, depression or increased stress.  She went to the ER last week for this and had an extensive evaluation that included a normal CT scan. She is on a number of medications for her migraines including phenergan, maxalt, naproxen and flexeril prescribed by her PCP. Of note, on review of recent PCP and ER notes, it seems pt was "extremely agitated" during encounters and left the ER before completion of her visit. She sees a specialist at [facility] for her blood count abnormalities.  ROS: See pertinent positives and negatives per HPI.  Past Medical History:  Diagnosis Date  . HYPERLIPIDEMIA 11/08/2008  . HYPERTENSION 11/08/2008  . LEUKOCYTOSIS UNSPECIFIED 11/08/2008   pt normally runs WBC 17,000-30,000  . MIGRAINE HEADACHE 04/28/2010    Past Surgical History:  Procedure Laterality Date  . DILATATION & CURETTAGE/HYSTEROSCOPY WITH MYOSURE N/A 11/12/2014   Procedure: DILATATION & CURETTAGE/HYSTEROSCOPY WITH MYOSURE;  Surgeon: Malachy Mood, MD;  Location: [facility] ORS;  Service: Gynecology;  Laterality: N/A;  .  TUBAL LIGATION  2009    Family History  Problem Relation Age of Onset  . Heart disease Other   . Cancer Other     breast grandmother, lung  . Alcohol abuse Other     parent, blood relative  . Hypertension Other     Social History   Social History  . Marital status: Married    Spouse name: N/A  . Number of children: N/A  . Years of education: N/A   Social History Main Topics  . Smoking status: Current Every Day Smoker    Packs/day: 1.00    Years: 20.00    Types: Cigarettes  . Smokeless tobacco: Never Used  . Alcohol use No  . Drug use: No  . Sexual activity: Not Asked   Other Topics Concern  . None   Social History Narrative  . None     Current Outpatient Prescriptions:  .  albuterol (PROAIR HFA) 108 (90 BASE) MCG/ACT inhaler, Inhale 2 puffs into the lungs every 6 (six) hours as needed for wheezing or shortness of breath., Disp: , Rfl:  .  cyclobenzaprine (FLEXERIL) 5 MG tablet, Take 1 tablet (5 mg total) by mouth at bedtime as needed., Disp: 30 tablet, Rfl: 2 .  hydrochlorothiazide (MICROZIDE) 12.5 MG capsule, Take 1 capsule (12.5 mg total) by mouth daily., Disp: 90 capsule, Rfl: 3 .  levonorgestrel (MIRENA) 20 MCG/24HR IUD, 1 each by Intrauterine route once. Placed 12/2014, Disp: , Rfl:  .  naproxen (NAPROSYN) 500 MG tablet, Take 1 tablet (500 mg total) by mouth 3 (three) times daily as needed., Disp: 60 tablet, Rfl:  3 .  promethazine (PHENERGAN) 25 MG tablet, Take one tablet by mouth every 6 hours as needed for headaches., Disp: 30 tablet, Rfl: 2 .  rizatriptan (MAXALT) 10 MG tablet, Take 1 tablet (10 mg total) by mouth as needed. May repeat in 2 hours if needed, Disp: 12 tablet, Rfl: 6  EXAM:  Vitals:   03/15/16 0706  BP: 118/78  Pulse: 74  Temp: 97.9 F (36.6 C)    Body mass index is 20.39 kg/m.  GENERAL: vitals reviewed and listed above, alert, oriented, appears well hydrated and in no acute distress  HEENT: atraumatic, conjunttiva clear, PERRLA,  EOMI, visual acuity grossly intact,  no obvious abnormalities on inspection of external nose and ears, normal appearance of ear canals and TMs, clear nasal congestion, mild post oropharyngeal erythema with PND, no tonsillar edema or exudate, no sinus TTP  NECK: no obvious masses on inspection  LUNGS: clear to auscultation bilaterally, no wheezes, rales or rhonchi, good air movement  CV: HRRR, no peripheral edema  ABD: BS+, soft, NTTP  MS: moves all extremities without noticeable abnormality, gait normal  PSYCH/NEURO: pleasant and cooperative, no obvious depression or anxiety, CN II-XII grossly intact, finger to nose normal, strength, DTRs, sensitivity to light touch normal throughout in upper and lower exetremities  ASSESSMENT AND PLAN:  Discussed the following assessment and plan:  Nausea and vomiting, intractability of vomiting not specified, unspecified vomiting type  Other migraine without status migrainosus, not intractable  Paresthesia - Plan: TSH, Vitamin B12  -we discussed possible serious and likely etiologies, workup and treatment, treatment risks and return precautions - migraine, viral illness, caffeine withdrawal, side effect to weed vs other all possible -advised labs given paresthesias, stopping weed as can cause nausea and vomiting, add back small amounts caffeine as  caffeine withdrawal could be contributing, try zofran, oral rehydration, quit smoking and neurology evaluation. Headache better today. -she declined neurology eval for now and opted to try other measures and follow up with PCP instead - she agreed to seek care sooner if worsening and see neurology if persistent symptoms   Patient Instructions  BEFORE YOU LEAVE: -follow up: 1 week with PCP -labs  Try to stop the weed and cut back slowly on cigarettes.  Plenty of fluids with electrolytes - sip slowly all day. Soup broth is good.  Try the zofran if needed for nausea.  Perhaps try a little  caffeine.  No dairy for 1 week.  Seek care immediately if worsening or new concerns.    Colin Benton R., DO

## 2016-03-15 ENCOUNTER — Ambulatory Visit (INDEPENDENT_AMBULATORY_CARE_PROVIDER_SITE_OTHER): Payer: BLUE CROSS/BLUE SHIELD | Admitting: Family Medicine

## 2016-03-15 ENCOUNTER — Encounter: Payer: Self-pay | Admitting: Family Medicine

## 2016-03-15 ENCOUNTER — Telehealth: Payer: Self-pay | Admitting: Family Medicine

## 2016-03-15 VITALS — BP 118/78 | HR 74 | Temp 97.9°F | Ht 62.0 in | Wt 111.5 lb

## 2016-03-15 DIAGNOSIS — R112 Nausea with vomiting, unspecified: Secondary | ICD-10-CM | POA: Diagnosis not present

## 2016-03-15 DIAGNOSIS — G43809 Other migraine, not intractable, without status migrainosus: Secondary | ICD-10-CM

## 2016-03-15 DIAGNOSIS — R202 Paresthesia of skin: Secondary | ICD-10-CM

## 2016-03-15 LAB — TSH: TSH: 0.72 u[IU]/mL (ref 0.35–4.50)

## 2016-03-15 LAB — VITAMIN B12: Vitamin B-12: 163 pg/mL — ABNORMAL LOW (ref 211–911)

## 2016-03-15 MED ORDER — ONDANSETRON HCL 4 MG PO TABS: 4.0000 mg | ORAL_TABLET | Freq: Three times a day (TID) | ORAL | 0 refills | Status: DC | PRN

## 2016-03-15 NOTE — Patient Instructions (Addendum)
BEFORE YOU LEAVE: -follow up: 1 week with PCP -labs  Try to stop the weed and cut back slowly on cigarettes.  Plenty of fluids with electrolytes - sip slowly all day. Soup broth is good.  Try the zofran if needed for nausea.  Perhaps try a little caffeine.  No dairy for 1 week.  Seek care immediately if worsening or new concerns.

## 2016-03-15 NOTE — Telephone Encounter (Signed)
sent 

## 2016-03-15 NOTE — Telephone Encounter (Signed)
Pt is looking for rx Zofran to be sent to  [address]/ [place]  Pt saw Dr Maudie Mercury this am.

## 2016-03-15 NOTE — Progress Notes (Signed)
Pre visit review using our clinic review tool, if applicable. No additional management support is needed unless otherwise documented below in the visit note. 

## 2016-03-16 NOTE — Telephone Encounter (Signed)
Kayla Mcintosh pt returned your call °

## 2016-03-16 NOTE — Telephone Encounter (Signed)
See results note. 

## 2016-03-21 ENCOUNTER — Ambulatory Visit (INDEPENDENT_AMBULATORY_CARE_PROVIDER_SITE_OTHER): Payer: BLUE CROSS/BLUE SHIELD | Admitting: Family Medicine

## 2016-03-21 VITALS — BP 102/80 | HR 108 | Temp 98.1°F | Ht 62.0 in | Wt 110.2 lb

## 2016-03-21 DIAGNOSIS — E538 Deficiency of other specified B group vitamins: Secondary | ICD-10-CM | POA: Diagnosis not present

## 2016-03-21 DIAGNOSIS — R42 Dizziness and giddiness: Secondary | ICD-10-CM

## 2016-03-21 DIAGNOSIS — R5383 Other fatigue: Secondary | ICD-10-CM | POA: Diagnosis not present

## 2016-03-21 DIAGNOSIS — R202 Paresthesia of skin: Secondary | ICD-10-CM

## 2016-03-21 MED ORDER — CYANOCOBALAMIN 1000 MCG/ML IJ SOLN
1000.0000 ug | Freq: Once | INTRAMUSCULAR | Status: AC
  Administered 2016-03-21: 1000 ug via INTRAMUSCULAR

## 2016-03-21 NOTE — Progress Notes (Signed)
Pre visit review using our clinic review tool, if applicable. No additional management support is needed unless otherwise documented below in the visit note. 

## 2016-03-21 NOTE — Patient Instructions (Signed)
Vitamin B12 oral What is this medicine? CYANOCOBALAMIN (sye an oh koe BAL a min) is a man made form of vitamin B12. Vitamin B12 is essential in the development of healthy blood cells, nerve cells, and proteins in the body. It also helps with the metabolism of fats and carbohydrates. It is added to a healthy diet to prevent or treat low vitamin B-12 levels. This medicine may be used for other purposes; ask your health care provider or pharmacist if you have questions. What should I tell my health care provider before I take this medicine? They need to know if you have any of these conditions: -anemia -kidney disease -Leber's disease -malabsorption disorder -an unusual or allergic reaction to cyanocobalamin, cobalt, other medicines, foods, dyes, or preservatives -pregnant or trying to get pregnant -breast-feeding How should I use this medicine? Take this medicine by mouth with a glass of water. Follow the directions on the package or prescription label. If you are taking the tablets, do not chew, cut, or crush this medicine. If using an vitamin solution, use a specially marked spoon or dropper to measure each dose. Ask your pharmacist if you do not have one. Household spoons are not accurate. For best results take this vitamin with food. Take your medicine at regular intervals. Do not take your medicine more often than directed. Talk to your pediatrician regarding the use of this medicine in children. While this drug may be prescribed for selected conditions, precautions do apply. Overdosage: If you think you have taken too much of this medicine contact a poison control center or emergency room at once. NOTE: This medicine is only for you. Do not share this medicine with others. What if I miss a dose? If you miss a dose, take it as soon as you can. If it is almost time for your next dose, take only that dose. Do not take double or extra doses. What may interact with this  medicine? -alcohol -aminosalicylic acid -colchicine -medicines that suppress your bone marrow like chemotherapy, chloramphenicol This list may not describe all possible interactions. Give your health care provider a list of all the medicines, herbs, non-prescription drugs, or dietary supplements you use. Also tell them if you smoke, drink alcohol, or use illegal drugs. Some items may interact with your medicine. What should I watch for while using this medicine? Follow a healthy diet. Taking a vitamin supplement does not replace the need for a balanced diet. Some foods that have vitamin B-12 naturally are fish, seafood, egg yolk, milk and fermented cheese. Too much of this vitamin can be unsafe. Talk to your doctor or health care provider about how much is right for you. What side effects may I notice from receiving this medicine? Side effects that you should report to your doctor or health care professional as soon as possible: -allergic reactions like skin rash, itching or hives, swelling of the face, lips, or tongue -breathing problems -chest pain, tightness Side effects that usually do not require medical attention (report to your doctor or health care professional if they continue or are bothersome): -diarrhea This list may not describe all possible side effects. Call your doctor for medical advice about side effects. You may report side effects to FDA at 1-800-FDA-1088. Where should I keep my medicine? Keep out of the reach of children. Store at room temperature between 15 and 30 degrees C (59 and 85 degrees F). Protect from heat and light. Throw away any unused medicine after the expiration date. NOTE: This   sheet is a summary. It may not cover all possible information. If you have questions about this medicine, talk to your doctor, pharmacist, or health care provider.  2017 Elsevier/Gold Standard (2011-09-04 07:58:17) Vitamin B12 Deficiency Vitamin B12 deficiency occurs when the body  does not have enough vitamin B12. Vitamin B12 is an important vitamin. The body needs vitamin B12:  To make red blood cells.  To make DNA. This is the genetic material inside cells.  To help the nerves work properly so they can carry messages from the brain to the body. Vitamin B12 deficiency can cause various health problems, such as a low red blood cell count (anemia) or nerve damage. What are the causes? This condition may be caused by:  Not eating enough foods that contain vitamin B12.  Not having enough stomach acid and digestive fluids to properly absorb vitamin B12 from the food that you eat.  Certain digestive system diseases that make it hard to absorb vitamin B12. These diseases include Crohn disease, chronic pancreatitis, and cystic fibrosis.  Pernicious anemia. This is a condition in which the body does not make enough of a protein (intrinsic factor), resulting in too few red blood cells.  Having a surgery in which part of the stomach or small intestine is removed.  Taking certain medicines that make it hard for the body to absorb vitamin B12. These medicines include:  Heartburn medicine (antacids and proton pump inhibitors).  An antibiotic medicine called neomycin.  Some medicines that are used to treat diabetes, tuberculosis, gout, or high cholesterol. What increases the risk? The following factors may make you more likely to develop a B12 deficiency:  Being older than age 28.  Eating a vegetarian or vegan diet, especially while you are pregnant.  Eating a poor diet while you are pregnant.  Taking certain drugs.  Having alcoholism. What are the signs or symptoms? In some cases, there are no symptoms of this condition. If the condition leads to anemia or nerve damage, various symptoms can occur, such as:  Weakness.  Fatigue.  Loss of appetite.  Weight loss.  Numbness or tingling in your hands and feet.  Redness and burning of the tongue.  Confusion  or memory problems.  Depression.  Sensory problems, such as color blindness, ringing in the ears, or loss of taste.  Diarrhea or constipation.  Trouble walking. If anemia is severe, symptoms can include:  Shortness of breath.  Dizziness.  Rapid heart rate (tachycardia). How is this diagnosed? This condition may be diagnosed with a blood test to measure the level of vitamin B12 in your blood. You may have other tests to help find the cause of your vitamin B12 deficiency. These tests may include:  A complete blood count (CBC). This is a group of tests that measure certain characteristics of blood cells.  A blood test to measure intrinsic factor.  An endoscopy. In this procedure, a thin tube with a camera on the end is used to look into your stomach or intestines. How is this treated? Treatment for this condition depends on the cause. Common treatment options include:  Changing your eating and drinking habits, such as:  Eating more foods that contain vitamin B12.  Drinking less alcohol or no alcohol.  Taking vitamin B12 supplements. Your health care provider will tell you which dosage is best for you.  Getting vitamin B12 injections. Follow these instructions at home:  Take supplements only as told by your health care provider. Follow the directions carefully.  Get any injections that are prescribed by your health care provider.  Do not miss your appointments.  Eat lots of healthy foods that contain vitamin B12. Ask your health care provider if you should work with a dietitian. Foods that contain vitamin B12 include:  Meat.  Meat from birds (poultry).  Fish.  Eggs.  Cereal and dairy products that are fortified. This means that vitamin B12 has been added to the food. Check the label on the package to see if the food is fortified.  Do not abuse alcohol.  Keep all follow-up visits as told by your health care provider. This is important. Contact a health care  provider if:  Your symptoms come back. Get help right away if:  You develop shortness of breath.  You have chest pain.  You become dizzy or you lose consciousness. This information is not intended to replace advice given to you by your health care provider. Make sure you discuss any questions you have with your health care provider. Document Released: 06/25/2011 Document Revised: 09/14/2015 Document Reviewed: 08/18/2014 Elsevier Interactive Patient Education  2017 [employer].

## 2016-03-21 NOTE — Progress Notes (Signed)
Subjective:     Patient ID: Kayla Mcintosh, female   DOB: 30-May-1970, 45 y.o.   MRN: [id number]  HPI Patient presents with couple week history of some progressive nonspecific symptoms of lightheadedness and fatigue. She's also had some intermittent tingling in upper extremities especially. No neck pain. She had recent TSH which was normal but B12 level came back 163. No chronic PPI use. No gastric surgery. She's not a vegetarian. No clear risk factors for low B12.  She denies any abdominal pain. No recent stool changes. She had CT of the head back in November which was unremarkable. Denies any consistent headaches.  Recent CBC revealed no evidence for anemia. Denies any lower extremity neuropathy symptoms  Past Medical History:  Diagnosis Date  . HYPERLIPIDEMIA 11/08/2008  . HYPERTENSION 11/08/2008  . LEUKOCYTOSIS UNSPECIFIED 11/08/2008   pt normally runs WBC 17,000-30,000  . MIGRAINE HEADACHE 04/28/2010   Past Surgical History:  Procedure Laterality Date  . DILATATION & CURETTAGE/HYSTEROSCOPY WITH MYOSURE N/A 11/12/2014   Procedure: DILATATION & CURETTAGE/HYSTEROSCOPY WITH MYOSURE;  Surgeon: Malachy Mood, MD;  Location: [facility] ORS;  Service: Gynecology;  Laterality: N/A;  . TUBAL LIGATION  2009    reports that she has been smoking Cigarettes.  She has a 20.00 pack-year smoking history. She has never used smokeless tobacco. She reports that she does not drink alcohol or use drugs. family history includes Alcohol abuse in her other; Cancer in her other; Heart disease in her other; Hypertension in her other. No Known Allergies   Review of Systems  Constitutional: Positive for fatigue. Negative for appetite change and fever.  Respiratory: Negative for cough.   Cardiovascular: Negative for chest pain.  Gastrointestinal: Positive for nausea. Negative for abdominal pain and vomiting.  Skin: Negative for rash.  Neurological: Positive for dizziness and light-headedness. Negative for syncope and  speech difficulty.  Hematological: Negative for adenopathy. Does not bruise/bleed easily.       Objective:   Physical Exam  Constitutional: She is oriented to person, place, and time. She appears well-developed and well-nourished.  Cardiovascular: Normal rate and regular rhythm.   Pulmonary/Chest: Effort normal and breath sounds normal. No respiratory distress. She has no wheezes. She has no rales.  Abdominal: Soft. Bowel sounds are normal. She exhibits no distension and no mass. There is no tenderness. There is no rebound and no guarding.  Neurological: She is alert and oriented to person, place, and time. No cranial nerve deficit. Coordination normal.  Finger-nose testing normal. Gait normal. No focal weakness. Symmetric reflexes upper and lower extremities       Assessment:     #1 multiple nonspecific symptoms including lightheadedness, fatigue, paresthesias of multiple extremities  #2 low B12 which may be accounting for symptoms above. No clear risk factors for B12 deficiency    Plan:     -We discussed the importance of B12 replacement. She was given option of oral placement vs injection. We elected to go ahead with B12 one thousand micrograms IM today and patient will start daily oral B12 1000 g once daily and reassess here in 2 weeks. Will need follow-up B12 level in 3 months. We also discussed foods high in [place] MD [facility]

## 2016-03-23 ENCOUNTER — Ambulatory Visit: Payer: BLUE CROSS/BLUE SHIELD | Admitting: Family Medicine

## 2016-04-04 ENCOUNTER — Ambulatory Visit (INDEPENDENT_AMBULATORY_CARE_PROVIDER_SITE_OTHER): Payer: BLUE CROSS/BLUE SHIELD

## 2016-04-04 DIAGNOSIS — E538 Deficiency of other specified B group vitamins: Secondary | ICD-10-CM | POA: Diagnosis not present

## 2016-04-04 MED ORDER — CYANOCOBALAMIN 1000 MCG/ML IJ SOLN
1000.0000 ug | Freq: Once | INTRAMUSCULAR | Status: AC
  Administered 2016-04-04: 1000 ug via INTRAMUSCULAR

## 2016-04-04 NOTE — Progress Notes (Signed)
Patient was given a B12 Injection for Vit B12 deficiency on 04/04/16 at 8.20 by [facility] CMA

## 2016-07-09 ENCOUNTER — Ambulatory Visit (INDEPENDENT_AMBULATORY_CARE_PROVIDER_SITE_OTHER): Payer: BLUE CROSS/BLUE SHIELD | Admitting: Family Medicine

## 2016-07-09 ENCOUNTER — Encounter: Payer: Self-pay | Admitting: Family Medicine

## 2016-07-09 VITALS — BP 132/92 | HR 78 | Temp 97.9°F | Wt 115.4 lb

## 2016-07-09 DIAGNOSIS — R05 Cough: Secondary | ICD-10-CM | POA: Diagnosis not present

## 2016-07-09 DIAGNOSIS — J0140 Acute pansinusitis, unspecified: Secondary | ICD-10-CM | POA: Diagnosis not present

## 2016-07-09 DIAGNOSIS — R059 Cough, unspecified: Secondary | ICD-10-CM

## 2016-07-09 MED ORDER — AMOXICILLIN-POT CLAVULANATE 875-125 MG PO TABS
1.0000 | ORAL_TABLET | Freq: Two times a day (BID) | ORAL | 0 refills | Status: DC
Start: 2016-07-09 — End: 2017-01-08

## 2016-07-09 NOTE — Progress Notes (Signed)
Pre visit review using our clinic review tool, if applicable. No additional management support is needed unless otherwise documented below in the visit note. 

## 2016-07-09 NOTE — Patient Instructions (Addendum)
Please take medication as directed and follow up if symptoms do not improve with treatment, worsen, or you develop a fever >101.  You can also add Mucinex DM for symptom relief   Sinusitis, Adult Sinusitis is soreness and inflammation of your sinuses. Sinuses are hollow spaces in the bones around your face. They are located:  Around your eyes.  In the middle of your forehead.  Behind your nose.  In your cheekbones. Your sinuses and nasal passages are lined with a stringy fluid (mucus). Mucus normally drains out of your sinuses. When your nasal tissues get inflamed or swollen, the mucus can get trapped or blocked so air cannot flow through your sinuses. This lets bacteria, viruses, and funguses grow, and that leads to infection. Follow these instructions at home: Medicines   Take, use, or apply over-the-counter and prescription medicines only as told by your doctor. These may include nasal sprays.  If you were prescribed an antibiotic medicine, take it as told by your doctor. Do not stop taking the antibiotic even if you start to feel better. Hydrate and Humidify   Drink enough water to keep your pee (urine) clear or pale yellow.  Use a cool mist humidifier to keep the humidity level in your home above 50%.  Breathe in steam for 10-15 minutes, 3-4 times a day or as told by your doctor. You can do this in the bathroom while a hot shower is running.  Try not to spend time in cool or dry air. Rest   Rest as much as possible.  Sleep with your head raised (elevated).  Make sure to get enough sleep each night. General instructions   Put a warm, moist washcloth on your face 3-4 times a day or as told by your doctor. This will help with discomfort.  Wash your hands often with soap and water. If there is no soap and water, use hand sanitizer.  Do not smoke. Avoid being around people who are smoking (secondhand smoke).  Keep all follow-up visits as told by your doctor. This is  important. Contact a doctor if:  You have a fever.  Your symptoms get worse.  Your symptoms do not get better within 10 days. Get help right away if:  You have a very bad headache.  You cannot stop throwing up (vomiting).  You have pain or swelling around your face or eyes.  You have trouble seeing.  You feel confused.  Your neck is stiff.  You have trouble breathing. This information is not intended to replace advice given to you by your health care provider. Make sure you discuss any questions you have with your health care provider. Document Released: 09/19/2007 Document Revised: 11/27/2015 Document Reviewed: 01/26/2015 Elsevier Interactive Patient Education  2017 [employer] NOW OFFER   Kayla Mcintosh's FAST TRACK!!!  SAME DAY Appointments for ACUTE CARE  Such as: Sprains, Injuries, cuts, abrasions, rashes, muscle pain, joint pain, back pain Colds, flu, sore throats, headache, allergies, cough, fever  Ear pain, sinus and eye infections Abdominal pain, nausea, vomiting, diarrhea, upset stomach Animal/insect bites  3 Easy Ways to Schedule: Walk-In Scheduling Call in scheduling Mychart Sign-up: [email or web address]

## 2016-07-09 NOTE — Progress Notes (Signed)
Patient ID: Kayla Mcintosh, female   DOB: 1970-05-21, 46 y.o.   MRN: [id number]  PCP: [name], MD  Subjective:  Kayla Mcintosh is a 46 y.o. year old very pleasant female patient who presents with Upper Respiratory infection symptoms including nasal congestion, sinus pressure/pain, sore throat, cough that is productive with yellow mucus. -started: 2 weeks ago, symptoms worsening. -previous treatments: Tylenol, ibuprofen, and sinus medication which has provided limited benefit. -sick contacts/travel/risks: Diagnosed with flu 2 weeks ago. She reports flu like symptoms of N/V have improved however sinus pressure/pain has worsened -Hx of: bronchitis and use of rescue inhaler which provided benefit. No antibiotics in the last 30 days. Smokes 1 ppd  ROS-denies fever, SOB, NVD, tooth pain  Pertinent Past Medical History- HTN, migraines  Medications- reviewed  Current Outpatient Prescriptions  Medication Sig Dispense Refill  . albuterol (PROAIR HFA) 108 (90 BASE) MCG/ACT inhaler Inhale 2 puffs into the lungs every 6 (six) hours as needed for wheezing or shortness of breath.    . cyclobenzaprine (FLEXERIL) 5 MG tablet Take 1 tablet (5 mg total) by mouth at bedtime as needed. 30 tablet 2  . hydrochlorothiazide (MICROZIDE) 12.5 MG capsule Take 1 capsule (12.5 mg total) by mouth daily. 90 capsule 3  . levonorgestrel (MIRENA) 20 MCG/24HR IUD 1 each by Intrauterine route once. Placed 12/2014    . naproxen (NAPROSYN) 500 MG tablet Take 1 tablet (500 mg total) by mouth 3 (three) times daily as needed. 60 tablet 3  . ondansetron (ZOFRAN) 4 MG tablet Take 1 tablet (4 mg total) by mouth every 8 (eight) hours as needed for nausea or vomiting. 20 tablet 0  . promethazine (PHENERGAN) 25 MG tablet Take one tablet by mouth every 6 hours as needed for headaches. 30 tablet 2  . rizatriptan (MAXALT) 10 MG tablet Take 1 tablet (10 mg total) by mouth as needed. May repeat in 2 hours if needed 12 tablet 6   No  current facility-administered medications for this visit.     Objective: BP (!) 138/96 (BP Location: Right Arm, Patient Position: Sitting, Cuff Size: Normal)   Pulse 78   Temp 97.9 F (36.6 C) (Oral)   Wt 115 lb 6.4 oz (52.3 kg)   LMP 07/07/2016 (Exact Date)   SpO2 97%   BMI 21.11 kg/m  Gen: NAD, resting comfortably HEENT: Turbinates erythematous, TM normal, pharynx mildly erythematous with no tonsilar exudate or edema, no sinus tenderness CV: RRR no murmurs rubs or gallops Lungs: CTAB no crackles, wheeze, rhonchi Abdomen: soft/nontender/nondistended/normal bowel sounds. No rebound or guarding.  Ext: no edema Skin: warm, dry, no rash Neuro: grossly normal, moves all extremities Retake of BP is 132/92  Assessment/Plan:  1. Acute pansinusitis, recurrence not specified Exam and history support treatment for sinusitis. Advised patient on supportive measures:  Get rest, drink plenty of fluids, and use tylenol s needed for pain and Mucinex DM for symptoms. Follow up if fever >101, if symptoms worsen or if symptoms are not improved in 3 to 4 days. Patient verbalizes understanding.   - amoxicillin-clavulanate (AUGMENTIN) 875-125 MG tablet; Take 1 tablet by mouth 2 (two) times daily.  Dispense: 20 tablet; Refill: 0  2. Cough Mucinex DM  Finally, we reviewed reasons to return to care including if symptoms worsen or persist or new concerns arise- once again particularly shortness of breath or fever.   Laurita Quint, FNP

## 2017-01-08 ENCOUNTER — Ambulatory Visit (INDEPENDENT_AMBULATORY_CARE_PROVIDER_SITE_OTHER): Payer: BLUE CROSS/BLUE SHIELD | Admitting: Family Medicine

## 2017-01-08 ENCOUNTER — Encounter: Payer: Self-pay | Admitting: Family Medicine

## 2017-01-08 VITALS — BP 110/80 | HR 80 | Temp 99.2°F | Wt 119.6 lb

## 2017-01-08 DIAGNOSIS — M545 Low back pain, unspecified: Secondary | ICD-10-CM

## 2017-01-08 DIAGNOSIS — I10 Essential (primary) hypertension: Secondary | ICD-10-CM | POA: Diagnosis not present

## 2017-01-08 DIAGNOSIS — G43809 Other migraine, not intractable, without status migrainosus: Secondary | ICD-10-CM

## 2017-01-08 MED ORDER — HYDROCHLOROTHIAZIDE 12.5 MG PO CAPS: 12.5000 mg | ORAL_CAPSULE | Freq: Every day | ORAL | 3 refills | Status: DC

## 2017-01-08 MED ORDER — CYCLOBENZAPRINE HCL 5 MG PO TABS: 5.0000 mg | ORAL_TABLET | Freq: Every evening | ORAL | 2 refills | Status: DC | PRN

## 2017-01-08 MED ORDER — PROMETHAZINE HCL 25 MG PO TABS: ORAL_TABLET | ORAL | 6 refills | Status: DC

## 2017-01-08 MED ORDER — RIZATRIPTAN BENZOATE 10 MG PO TABS: 10.0000 mg | ORAL_TABLET | ORAL | 6 refills | Status: DC | PRN

## 2017-01-08 MED ORDER — NAPROXEN 500 MG PO TABS: 500.0000 mg | ORAL_TABLET | Freq: Three times a day (TID) | ORAL | 3 refills | Status: DC | PRN

## 2017-01-08 NOTE — Progress Notes (Signed)
Subjective:     Patient ID: Kayla Mcintosh, female   DOB: 04/30/1970, 46 y.o.   MRN: [id number]  HPI Patient seen for medical follow-up. She has chronic leukocytosis which is actually improving and is followed at [facility]. She's had this for several years. CLL has been ruled out. She had recent labs including CBC and comprehensive metabolic panel. These were reviewed and stable. She has hypertension treated with HCTZ. Migraine headaches treated with naproxen and Maxalt. She thinks is her menstrual related. She sees GYN now on has IUD which is helped with her irregular menses and generally has about 1 migraine headache flare every 2-3 months. She takes Phenergan occasionally as needed with migraine flares in each refills of several medications.  Intermittent back pain radiating toward the hip region. She's taken Flexeril which seems to help with that. Requesting refill. She declines flu vaccination.   Past Medical History:  Diagnosis Date  . HYPERLIPIDEMIA 11/08/2008  . HYPERTENSION 11/08/2008  . LEUKOCYTOSIS UNSPECIFIED 11/08/2008   pt normally runs WBC 17,000-30,000  . MIGRAINE HEADACHE 04/28/2010   Past Surgical History:  Procedure Laterality Date  . DILATATION & CURETTAGE/HYSTEROSCOPY WITH MYOSURE N/A 11/12/2014   Procedure: DILATATION & CURETTAGE/HYSTEROSCOPY WITH MYOSURE;  Surgeon: Kayla Mood, MD;  Location: [facility] ORS;  Service: Gynecology;  Laterality: N/A;  . TUBAL LIGATION  2009    reports that she has been smoking Cigarettes.  She has a 20.00 pack-year smoking history. She has never used smokeless tobacco. She reports that she does not drink alcohol or use drugs. family history includes Alcohol abuse in her other; Cancer in her other; Heart disease in her other; Hypertension in her other. No Known Allergies     Review of Systems  Constitutional: Negative for fatigue.  Eyes: Negative for visual disturbance.  Respiratory: Negative for cough, chest tightness, shortness of breath and  wheezing.   Cardiovascular: Negative for chest pain, palpitations and leg swelling.  Endocrine: Negative for polydipsia and polyuria.  Genitourinary: Negative for dysuria.  Neurological: Negative for dizziness, seizures, syncope, weakness, light-headedness and headaches.       Objective:   Physical Exam  Constitutional: She appears well-developed and well-nourished.  Eyes: Pupils are equal, round, and reactive to light.  Neck: Neck supple. No JVD present. No thyromegaly present.  Cardiovascular: Normal rate and regular rhythm.  Exam reveals no gallop.   Pulmonary/Chest: Effort normal and breath sounds normal. No respiratory distress. She has no wheezes. She has no rales.  Musculoskeletal: She exhibits no edema.  Neurological: She is alert.       Assessment:     #1 hypertension stable and at goal.   #2 history of migraine headaches  #3 intermittent low back pain    Plan:     -Refill HCTZ for one year. She had recent chemistries which were normal -Refill migraine medications including Maxalt, naproxen, and Phenergan -Refilled Flexeril for as needed use -Flu vaccine offered and declined  Kayla Post MD [facility]

## 2017-08-26 IMAGING — CT CT HEAD W/O CM
3 series · 16 of 45 positions shown, 19 images · non-contrast
Comparison: None.

CLINICAL DATA: Truncal ataxia with headache and fatigue

EXAM:
CT HEAD WITHOUT CONTRAST
TECHNIQUE: Contiguous axial images were obtained from the base of the skull
through the vertex without intravenous contrast.

[Series 2: head wo · axial · 0.38mm/px · z∈[-204,-89]mm · 10 of 28 slices shown, 13 images]
[im 3/28  brain]
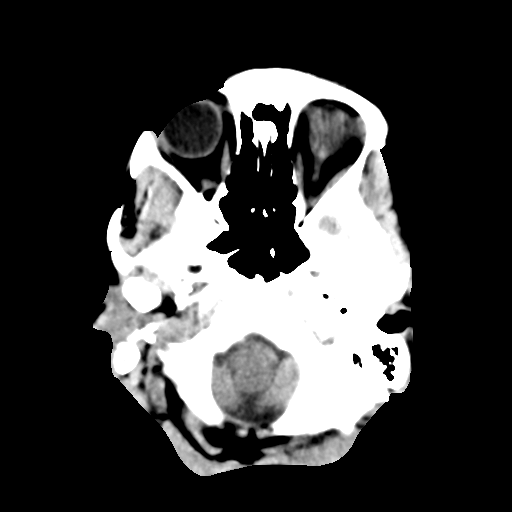
[im 3/28  bone]
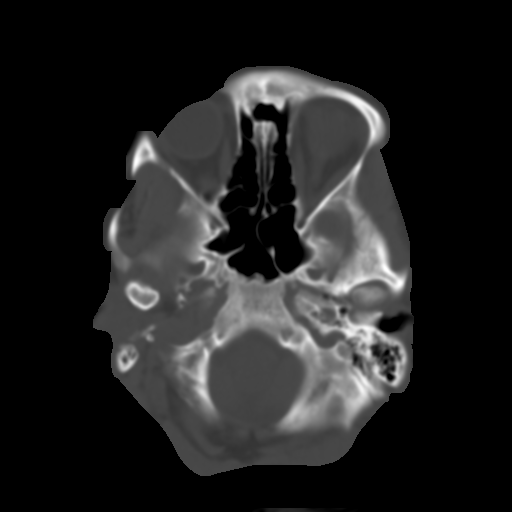
[im 5/28  brain]
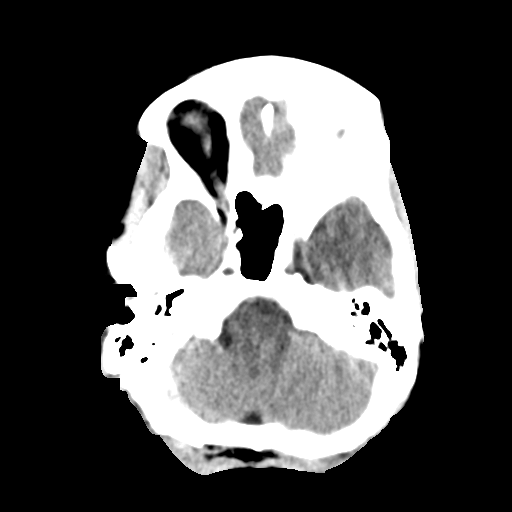
[im 8/28  brain]
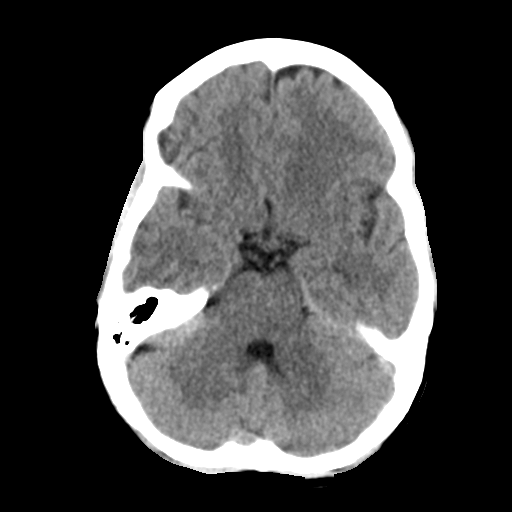
[im 11/28  brain]
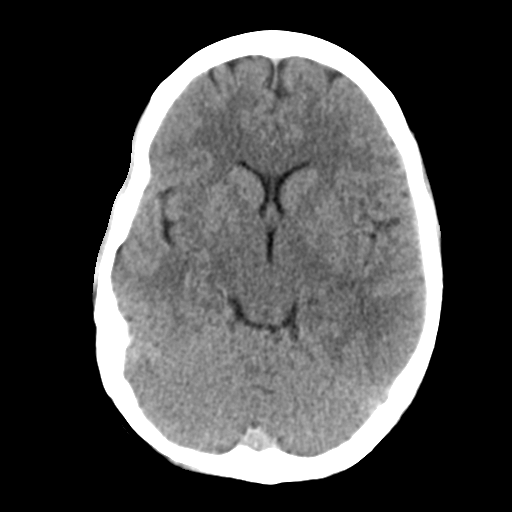
[im 13/28  brain]
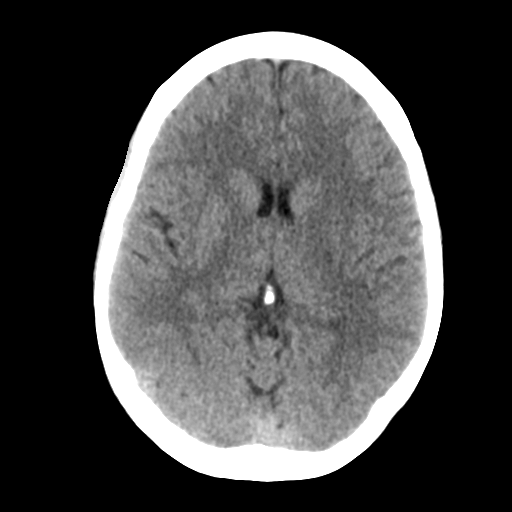
[im 13/28  bone]
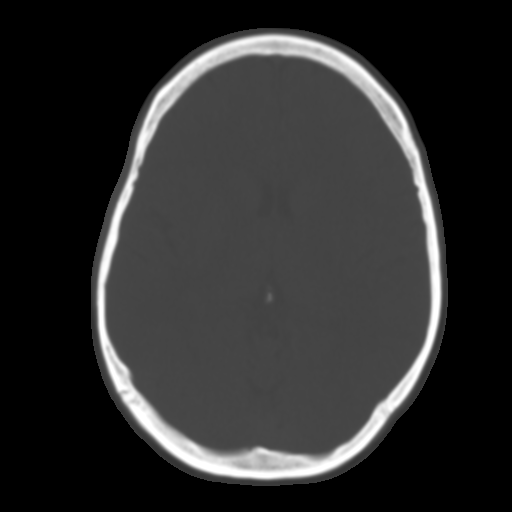
[im 16/28  brain]
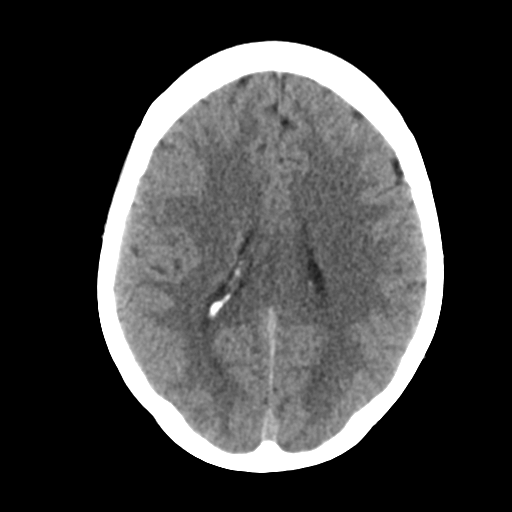
[im 18/28  brain]
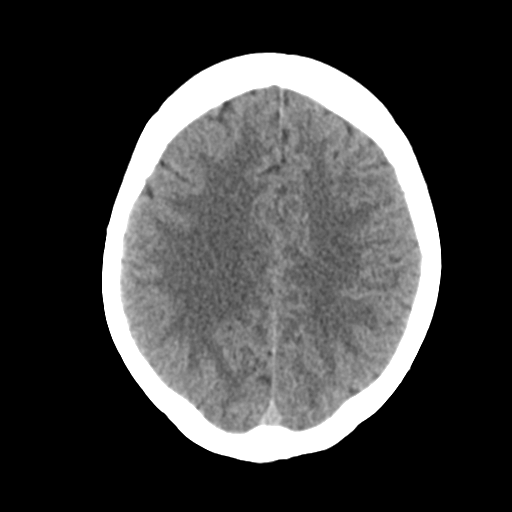
[im 21/28  brain]
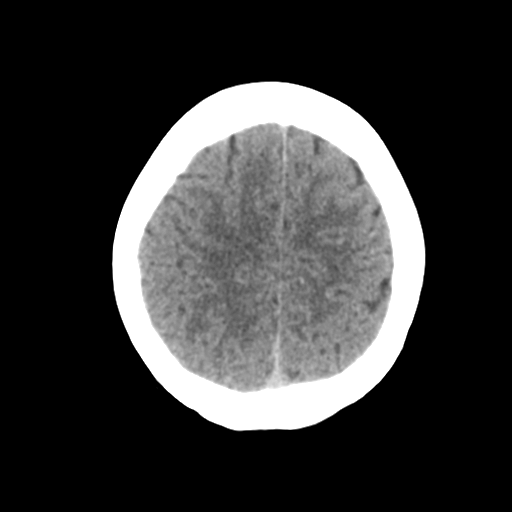
[im 24/28  brain]
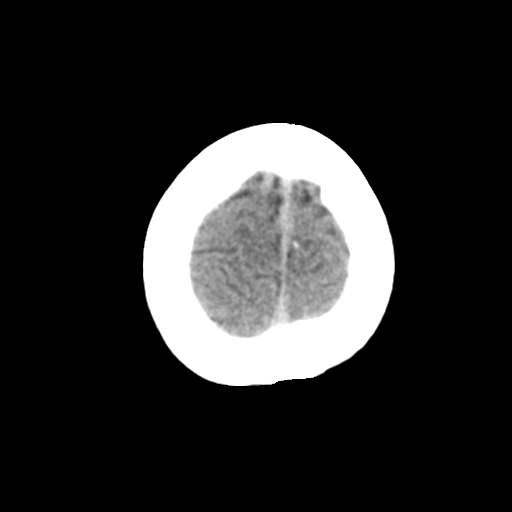
[im 24/28  bone]
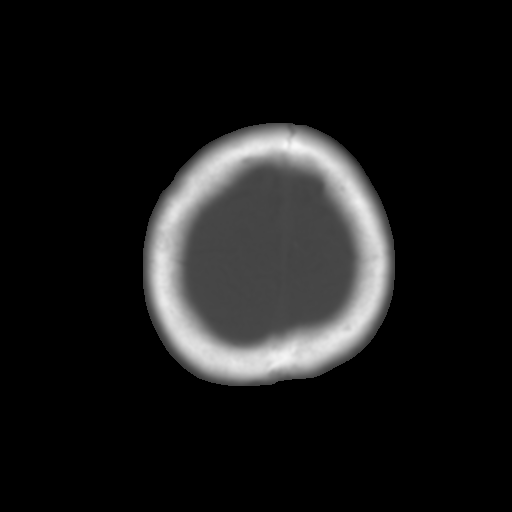
[im 26/28  brain]
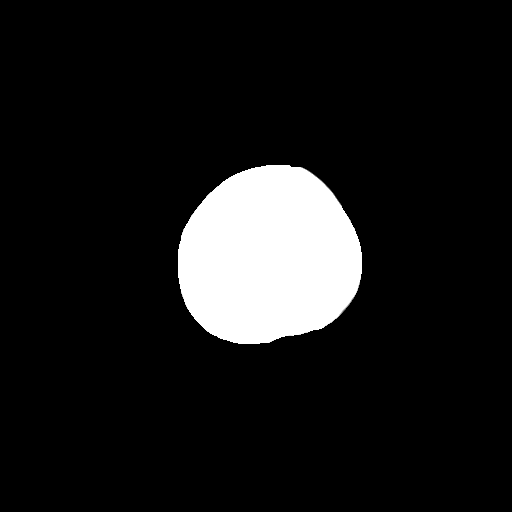

[Series 4: coronal soft tissue · coronal · 0.27mm/px · 3 of 59 slices shown]
[im 20/59  brain]
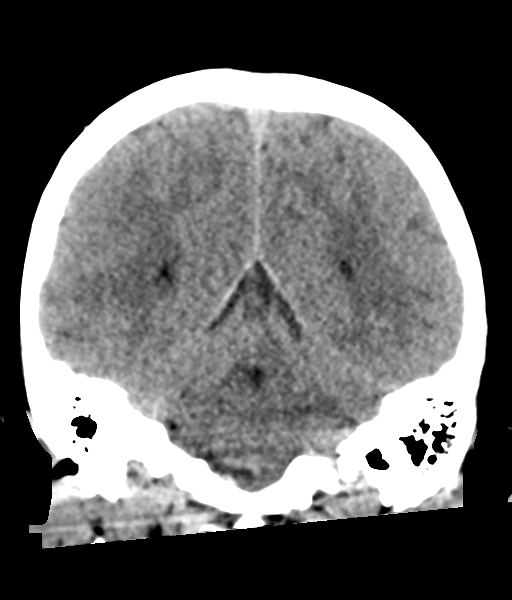
[im 26/59  brain]
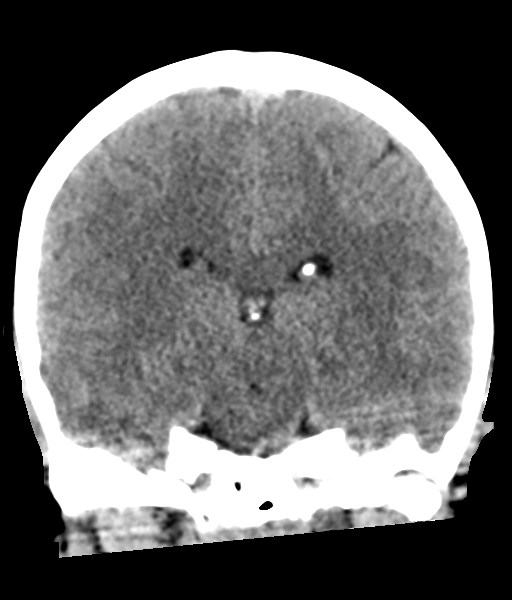
[im 33/59  brain]
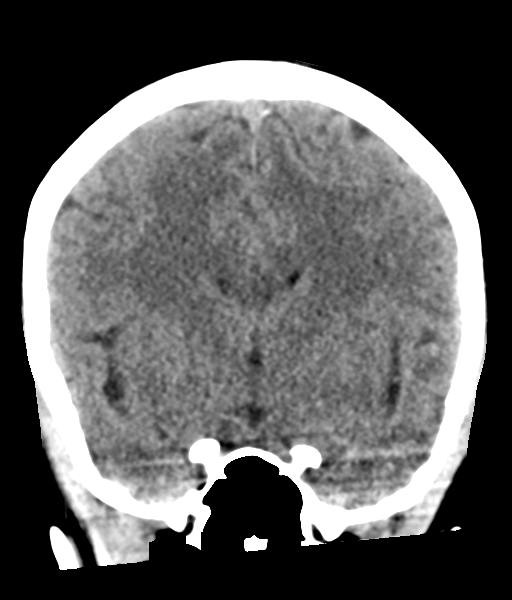

[Series 5: sagittal soft tissue · sagittal · 0.32mm/px · 3 of 46 slices shown]
[im 16/46  brain]
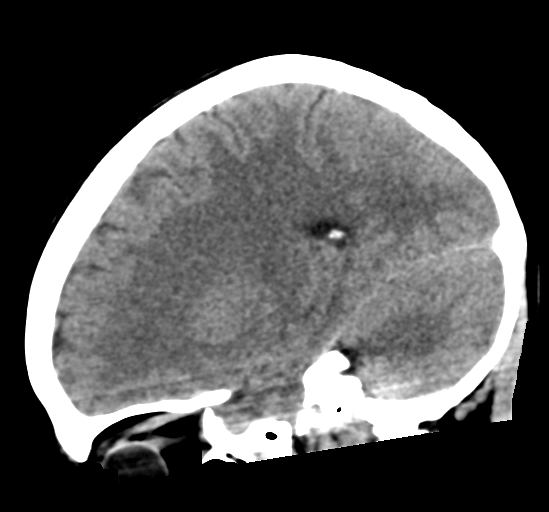
[im 23/46  brain]
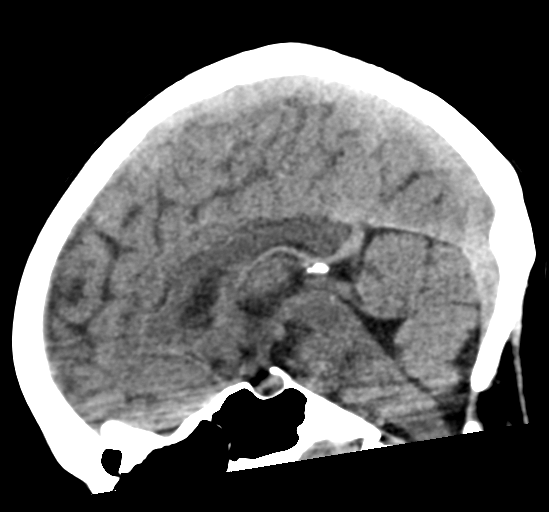
[im 31/46  brain]
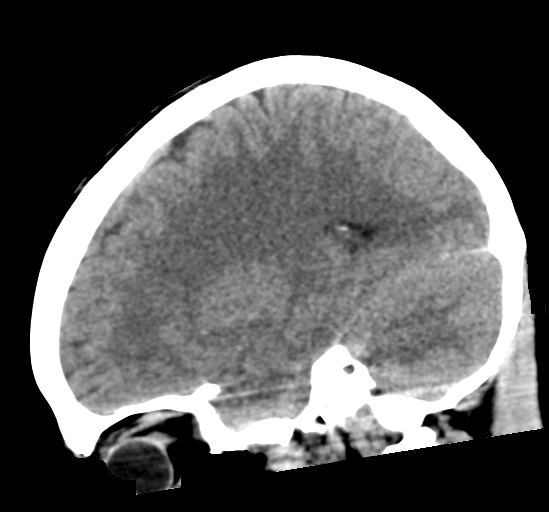

[16 of 45 positions shown; findings below may reference images not displayed]

FINDINGS: Brain: The ventricles are normal in size and configuration. There is
no intracranial mass, hemorrhage, extra-axial fluid collection or
midline shift. Gray-white compartments appear normal. No acute
infarct evident.

Vascular: No hyperdense vessel evident. No vascular calcifications
are appreciable.

Skull: The bony calvarium appears intact.

Sinuses/Orbits: Visualized paranasal sinuses are clear. Visualized
orbits appear symmetric bilaterally.

Other: Mastoid air cells are clear.
IMPRESSION: Study within normal limits.

## 2018-09-15 ENCOUNTER — Other Ambulatory Visit: Payer: Self-pay

## 2018-09-17 ENCOUNTER — Ambulatory Visit (INDEPENDENT_AMBULATORY_CARE_PROVIDER_SITE_OTHER): Payer: BC Managed Care – PPO | Admitting: Family Medicine

## 2018-09-17 ENCOUNTER — Other Ambulatory Visit: Payer: Self-pay

## 2018-09-17 ENCOUNTER — Encounter: Payer: Self-pay | Admitting: Family Medicine

## 2018-09-17 VITALS — BP 138/74 | HR 70 | Temp 98.4°F | Resp 16 | Ht 62.5 in | Wt 120.6 lb

## 2018-09-17 DIAGNOSIS — M545 Low back pain, unspecified: Secondary | ICD-10-CM

## 2018-09-17 DIAGNOSIS — I10 Essential (primary) hypertension: Secondary | ICD-10-CM | POA: Diagnosis not present

## 2018-09-17 DIAGNOSIS — G43809 Other migraine, not intractable, without status migrainosus: Secondary | ICD-10-CM | POA: Diagnosis not present

## 2018-09-17 LAB — TSH: TSH: 0.98 u[IU]/mL (ref 0.35–4.50)

## 2018-09-17 LAB — COMPREHENSIVE METABOLIC PANEL
ALT: 16 U/L (ref 0–35)
AST: 12 U/L (ref 0–37)
Albumin: 4.4 g/dL (ref 3.5–5.2)
Alkaline Phosphatase: 72 U/L (ref 39–117)
BUN: 16 mg/dL (ref 6–23)
CO2: 26 mEq/L (ref 19–32)
Calcium: 9.6 mg/dL (ref 8.4–10.5)
Chloride: 105 mEq/L (ref 96–112)
Creatinine, Ser: 0.96 mg/dL (ref 0.40–1.20)
GFR: 62.64 mL/min (ref >60.00)
Glucose, Bld: 90 mg/dL (ref 70–99)
Potassium: 4.6 mEq/L (ref 3.5–5.1)
Sodium: 140 mEq/L (ref 135–145)
Total Bilirubin: 0.4 mg/dL (ref 0.2–1.2)
Total Protein: 7 g/dL (ref 6.0–8.3)

## 2018-09-17 LAB — CBC WITH DIFFERENTIAL/PLATELET
Basophils Absolute: 0 10*3/uL (ref 0.0–0.1)
Basophils Relative: 0.4 % (ref 0.0–3.0)
Eosinophils Absolute: 0.1 10*3/uL (ref 0.0–0.7)
Eosinophils Relative: 0.7 % (ref 0.0–5.0)
HCT: 45.4 % (ref 36.0–46.0)
Hemoglobin: 15.4 g/dL — ABNORMAL HIGH (ref 12.0–15.0)
Lymphocytes Relative: 25.5 % (ref 12.0–46.0)
Lymphs Abs: 2.2 10*3/uL (ref 0.7–4.0)
MCHC: 34 g/dL (ref 30.0–36.0)
MCV: 91.3 fl (ref 78.0–100.0)
Monocytes Absolute: 0.6 10*3/uL (ref 0.1–1.0)
Monocytes Relative: 6.7 % (ref 3.0–12.0)
Neutro Abs: 5.7 10*3/uL (ref 1.4–7.7)
Neutrophils Relative %: 66.7 % (ref 43.0–77.0)
Platelets: 160 10*3/uL (ref 150.0–400.0)
RBC: 4.97 Mil/uL (ref 3.87–5.11)
RDW: 14.6 % (ref 11.5–15.5)
WBC: 8.5 10*3/uL (ref 4.0–10.5)

## 2018-09-17 LAB — LIPID PANEL
Cholesterol: 185 mg/dL (ref 0–200)
HDL: 35.9 mg/dL — ABNORMAL LOW (ref >39.00)
LDL Cholesterol: 135 mg/dL — ABNORMAL HIGH (ref 0–99)
NonHDL: 149.1
Total CHOL/HDL Ratio: 5
Triglycerides: 69 mg/dL (ref 0.0–149.0)
VLDL: 13.8 mg/dL (ref 0.0–40.0)

## 2018-09-17 LAB — B12 AND FOLATE PANEL
Folate: 8.2 ng/mL (ref >5.9)
Vitamin B-12: 680 pg/mL (ref 211–911)

## 2018-09-17 LAB — VITAMIN D 25 HYDROXY (VIT D DEFICIENCY, FRACTURES): VITD: 23.79 ng/mL — ABNORMAL LOW (ref 30.00–100.00)

## 2018-09-17 MED ORDER — ALBUTEROL SULFATE HFA 108 (90 BASE) MCG/ACT IN AERS: 2.0000 | INHALATION_SPRAY | Freq: Four times a day (QID) | RESPIRATORY_TRACT | 5 refills | Status: DC | PRN

## 2018-09-17 MED ORDER — HYDROCHLOROTHIAZIDE 12.5 MG PO CAPS: 12.5000 mg | ORAL_CAPSULE | Freq: Every day | ORAL | 3 refills | Status: DC

## 2018-09-17 MED ORDER — PROMETHAZINE HCL 25 MG PO TABS: ORAL_TABLET | ORAL | 6 refills | Status: AC

## 2018-09-17 MED ORDER — PROMETHAZINE HCL 25 MG PO TABS: ORAL_TABLET | ORAL | 6 refills | Status: DC

## 2018-09-17 MED ORDER — CYCLOBENZAPRINE HCL 5 MG PO TABS: 5.0000 mg | ORAL_TABLET | Freq: Every evening | ORAL | 2 refills | Status: AC | PRN

## 2018-09-17 MED ORDER — RIZATRIPTAN BENZOATE 10 MG PO TABS: 10.0000 mg | ORAL_TABLET | ORAL | 6 refills | Status: AC | PRN

## 2018-09-17 MED ORDER — NAPROXEN 500 MG PO TABS: 500.0000 mg | ORAL_TABLET | Freq: Three times a day (TID) | ORAL | 3 refills | Status: AC | PRN

## 2018-09-17 NOTE — Progress Notes (Signed)
Subjective:    Patient ID: Kayla Mcintosh, female    DOB: [birth year], 48 y.o.   MRN: [id number]  HPI    Patient presents to clinic to establish with new PCP.  Overall she is feeling well at this time.  She would like refills on her migraine medication that she uses as needed for breakthrough migraines, had success with use of rizatriptan, Phenergan and naproxen as needed.  Also would like refill of muscle relaxer she uses as needed for back pain, uses on occasion but likes to have on hand.  Patient also has a history of elevated blood pressure.  She had been off of her BP medication for a few months, but has noticed her BP is starting to slowly go back up.  She would like to resume taking her hydrochlorothiazide.  Patient also reports a history of elevated white cells, she was following with hematology however her elevated white cells came down on their own and she was since discharge from hematology.  Hematologist is unable to give her an exact diagnosis as to why her white cells are elevated however suggest that she have CBC monitored at least every 6 months to annually to keep close eye.  Past Medical History:  Diagnosis Date  . HYPERLIPIDEMIA 11/08/2008  . HYPERTENSION 11/08/2008  . LEUKOCYTOSIS UNSPECIFIED 11/08/2008   pt normally runs WBC 17,000-30,000  . MIGRAINE HEADACHE 04/28/2010   Past Surgical History:  Procedure Laterality Date  . DILATATION & CURETTAGE/HYSTEROSCOPY WITH MYOSURE N/A 11/12/2014   Procedure: DILATATION & CURETTAGE/HYSTEROSCOPY WITH MYOSURE;  Surgeon: Malachy Mood, MD;  Location: [facility] ORS;  Service: Gynecology;  Laterality: N/A;  . TUBAL LIGATION  2009   Family History  Problem Relation Age of Onset  . Heart disease Other   . Cancer Other        breast grandmother, lung  . Alcohol abuse Other        parent, blood relative  . Hypertension Other    Social History   Tobacco Use  . Smoking status: Current Every Day Smoker    Packs/day: 1.00    Years:  20.00    Pack years: 20.00    Types: Cigarettes  . Smokeless tobacco: Never Used  Substance Use Topics  . Alcohol use: No   Review of Systems  Constitutional: Negative for chills, fatigue and fever.  HENT: Negative for congestion, ear pain, sinus pain and sore throat.   Eyes: Negative.   Respiratory: Negative for cough, shortness of breath and wheezing.   Cardiovascular: Negative for chest pain, palpitations and leg swelling.  Gastrointestinal: Negative for abdominal pain, diarrhea, nausea and vomiting.  Genitourinary: Negative for dysuria, frequency and urgency.  Musculoskeletal: Negative for arthralgias and myalgias.  Skin: Negative for color change, pallor and rash.  Neurological: Negative for syncope, light-headedness and headaches.  Psychiatric/Behavioral: The patient is not nervous/anxious.       Objective:   Physical Exam Vitals signs and nursing note reviewed.  Constitutional:      Appearance: She is well-developed.  HENT:     Head: Normocephalic and atraumatic.     Right Ear: External ear normal.     Left Ear: External ear normal.     Nose: Nose normal.  Eyes:     General: No scleral icterus.       Right eye: No discharge.        Left eye: No discharge.     Conjunctiva/sclera: Conjunctivae normal.     Pupils: Pupils are equal, round,  and reactive to light.  Neck:     Musculoskeletal: Normal range of motion and neck supple. No neck rigidity.     Trachea: No tracheal deviation.  Cardiovascular:     Rate and Rhythm: Normal rate and regular rhythm.     Heart sounds: Normal heart sounds. No murmur. No friction rub. No gallop.   Pulmonary:     Effort: Pulmonary effort is normal. No respiratory distress.     Breath sounds: Normal breath sounds. No wheezing, rhonchi or rales.  Chest:     Chest wall: No tenderness.  Abdominal:     General: Bowel sounds are normal.     Palpations: Abdomen is soft.     Tenderness: There is no abdominal tenderness. There is no  guarding.  Musculoskeletal: Normal range of motion.        General: No deformity.  Lymphadenopathy:     Cervical: No cervical adenopathy.  Skin:    General: Skin is warm and dry.     Capillary Refill: Capillary refill takes less than 2 seconds.     Coloration: Skin is not pale.     Findings: No erythema.  Neurological:     General: No focal deficit present.     Mental Status: She is alert and oriented to person, place, and time.     Cranial Nerves: No cranial nerve deficit.  Psychiatric:        Mood and Affect: Mood normal.        Behavior: Behavior normal.        Thought Content: Thought content normal.      Today's Vitals   09/17/18 0818  BP: 138/74  Pulse: 70  Resp: 16  Temp: 98.4 F (36.9 C)  TempSrc: Oral  SpO2: 96%  Weight: 120 lb 9.6 oz (54.7 kg)  Height: 5' 2.5" (1.588 m)   Body mass index is 21.71 kg/m.      Assessment & Plan:    Essential hypertension-patient will resume hydrochlorothiazide.  She has taken this off and on for years and that usually it has good success in reducing BP.  She will also follow healthy diet and regular exercise program.  Migraine-patient needs another triptan, naproxen and Phenergan as needed for breakthrough migraine.  He does not have migraines often and use these with success.  Chronic low back pain-patient uses muscle relaxer for low back pain flareup on occasion.  Patient will follow-up in a few months for complete physical exam including Pap smear.  Blood work drawn in clinic today.

## 2018-12-26 ENCOUNTER — Other Ambulatory Visit: Payer: Self-pay

## 2018-12-26 ENCOUNTER — Telehealth: Payer: Self-pay | Admitting: Family Medicine

## 2018-12-26 ENCOUNTER — Encounter: Payer: BC Managed Care – PPO | Admitting: Family Medicine

## 2018-12-26 DIAGNOSIS — Z0289 Encounter for other administrative examinations: Secondary | ICD-10-CM

## 2018-12-26 NOTE — Telephone Encounter (Addendum)
The patient came in today to have a physical done. Per the registrar she was pre- screened before coming into the office. However she did not state she had any symptoms of covid19 or been in contact with anyone with Covid19 . On today the patient stated at check in that she has been in contact with someone that was suspected to have Covid 19 and she has a cough, sneezing and runny nose and while being asked these questions the patient turned to the other patients in the lobby and stated this is ridiculous. Due to her symptoms she was asked to go to her car and we would call her. She stated good luck finding her car. I spoke with her provider due to her symptoms . The provider informed me that she could do a virtual visit for her symptoms and reschedule her physical. I called the patient twice on the phone and she hung up on me twice. I located the patient in front of the building in her car and I went outside and asked if she didn't mind having a virtual visit for her symptoms. She stated no, she needed to see her provider for her pap . I informed her she will be seen at a later date. However we could see her today virtually for her symptoms. Again ,she stated she needed to see her doctor.  She  informed me that she will always have a cough and she needed a pap and to call her after the election when all of this will go away, while lighting up a cigarette. Then she proceeded to start her car and backing out of the parking lot. Not considering that I was standing beside the car.

## 2018-12-26 NOTE — Telephone Encounter (Signed)
FYI  Pt Dismissed from Department The patient came in today to have a physical done. Per the registrar she was pre- screened before coming into the office. However she did not state she had any symptoms of covid19 or been in contact with anyone with Covid19 . On today the patient stated at check in that she has been in contact with someone that was suspected to have Covid 19 and she has a cough, sneezing and runny nose and while being asked these questions the patient turned to the other patients in the lobby and stated this is ridiculous. Due to her symptoms she was asked to go to her car and we would call her. She stated good luck finding her car. I spoke with her provider due to her symptoms . The provider informed me that she could do a virtual visit for her symptoms and reschedule her physical. I called the patient twice on the phone and she hung up on me twice. I located the patient in front of the building in her car and I went outside and asked if she didn't mind having a virtual visit for her symptoms. She stated no, she needed to see her provider for her pap . I informed her she will be seen at a later date. However we could see her today virtually for her symptoms. Again ,she stated she needed to see her doctor.  She  informed me that she will always have a cough and she needed a pap and to call her after the election when all of this will go away, while lighting up a cigarette. Then she proceeded to start her car and backing out of the parking lot. Not considering that I was standing beside the car.

## 2018-12-29 ENCOUNTER — Encounter: Payer: Self-pay | Admitting: Family Medicine

## 2018-12-29 NOTE — Telephone Encounter (Signed)
Thank you for this documentation. Hostility to staff cannot be tolerated.  Philis Nettle FNP

## 2018-12-30 NOTE — Telephone Encounter (Signed)
Patient's dismissal letter has been sent to medical records to forward to the patient.

## 2018-12-31 ENCOUNTER — Telehealth: Payer: Self-pay | Admitting: Family Medicine

## 2018-12-31 NOTE — Telephone Encounter (Signed)
Patient dismissed from [facility] by Philis Nettle NP, effective 12/29/18. Dismissal Letter sent out by 1st class mail. [employer]

## 2021-05-02 ENCOUNTER — Other Ambulatory Visit: Payer: Self-pay | Admitting: Family Medicine

## 2021-05-02 DIAGNOSIS — Z1231 Encounter for screening mammogram for malignant neoplasm of breast: Secondary | ICD-10-CM

## 2021-06-26 ENCOUNTER — Ambulatory Visit
Admission: RE | Admit: 2021-06-26 | Discharge: 2021-06-26 | Disposition: A | Discharge status: Home / Self Care | Payer: BC Managed Care – PPO | Source: Ambulatory Visit | Attending: Family Medicine | Admitting: Family Medicine

## 2021-06-26 ENCOUNTER — Other Ambulatory Visit: Payer: Self-pay

## 2021-06-26 DIAGNOSIS — Z1231 Encounter for screening mammogram for malignant neoplasm of breast: Secondary | ICD-10-CM | POA: Insufficient documentation

## 2021-07-14 ENCOUNTER — Other Ambulatory Visit: Payer: Self-pay | Admitting: Obstetrics and Gynecology

## 2021-07-31 NOTE — H&P (Signed)
[name] is a 51 y.o. female here for [facility] Felicia-colposcopy ?. ?  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  ?Specimen: - [facility] Comment   ?Comment: Specimen A-Endocervical Curettings  ?DIAGNOSIS: - [facility] See below: Abnormal    ?Comment: Specimen A-FRAGMENTS OF NEGATIVE ENDOCERVIX WITH  ?FRAGMENT(S) OF SQUAMOUS EPITHELIUM WITH A HIGH GRADE  ?SQUAMOUS INTRAEPITHELIAL LESION (CIN 3).  ? ?  ?Past Medical History:  has a past medical history of Essential hypertension (11/08/2008), Hyperlipidemia (11/08/2008), and Migraine (04/28/2010).  ?Past Surgical History:  has a past surgical history that includes Dilation and curettage of uterus and Tubal ligation. ?Family History: family history includes Heart disease in her father, paternal grandfather, and paternal grandmother; Liver disease in her mother. ?Social History:  reports that she has been smoking cigarettes. She has a 30.00 pack-year smoking history. She has never used smokeless tobacco. She reports that she does not drink alcohol and does not use drugs. ?OB/GYN History:  ?        ?OB History   ?  Gravida  ?1  ? Para  ?1  ? Term  ?1  ? Preterm  ?   ? AB  ?   ? Living  ?1  ?  ?  SAB  ?   ? IAB  ?   ? Ectopic  ?   ? Molar  ?   ? Multiple  ?   ? Live Births  ?1  ?  ?  ?   ?  ?  ?Allergies: has No Known Allergies. ?Medications: ?  ?Current Outpatient Medications:  ?  chlorhexidine (PERIDEX) 0.12 % solution, Swish and spit once daily., Disp: , Rfl: 0 ?  cyanocobalamin (VITAMIN B12) 1000 MCG tablet, Take 1,000 mcg by mouth once daily., Disp: , Rfl:  ?  cyclobenzaprine (FLEXERIL) 5 MG tablet, Take 1 tablet (5 mg total) by mouth at bedtime as needed for Muscle spasms, Disp: 30 tablet, Rfl: 5 ?  hydroCHLOROthiazide  (HYDRODIURIL) 12.5 MG tablet, Take 1 tablet (12.5 mg total) by mouth once daily, Disp: 90 tablet, Rfl: 3 ?  montelukast (SINGULAIR) 10 mg tablet, Take 1 tablet (10 mg total) by mouth at bedtime, Disp: 90 tablet, Rfl: 3 ?  naproxen (NAPROSYN) 500 MG tablet, Take 1 tablet (500 mg total) by mouth 2 (two) times daily as needed, Disp: 30 tablet, Rfl: 5 ?  promethazine (PHENERGAN) 25 MG tablet, Take 1 tablet (25 mg total) by mouth every 6 (six) hours as needed for Nausea, Disp: 30 tablet, Rfl: 1 ?  rizatriptan (MAXALT) 10 MG tablet, Take 1 tablet (10 mg total) by mouth as directed for Migraine May take a second dose after 2 hours if needed., Disp: 6 tablet, Rfl: 5 ?  levonorgestrel (MIRENA 52 MG) 20 mcg/24 hr (5 years) IUD, Insert into the uterus (Patient not taking: Reported on 07/06/2021), Disp: , Rfl:  ?  ?Review of Systems: ?General:                      No fatigue or weight loss ?Eyes:                           No vision  changes ?Ears:                            No hearing difficulty ?Respiratory:                No cough or shortness of breath ?Pulmonary:                  No asthma or shortness of breath ?Cardiovascular:           No chest pain, palpitations, dyspnea on exertion ?Gastrointestinal:          No abdominal bloating, chronic diarrhea, constipations, masses, pain or hematochezia ?Genitourinary:             No hematuria, dysuria, abnormal vaginal discharge, pelvic pain, Menometrorrhagia ?Lymphatic:                   No swollen lymph nodes ?Musculoskeletal:         No muscle weakness ?Neurologic:                  No extremity weakness, syncope, seizure disorder ?Psychiatric:                  No history of depression, delusions or suicidal/homicidal ideation ?  ?  ? Exam:  ?  ?   ?Vitals:  ?  07/06/21 1605  ?BP: 132/79  ?Pulse: 77  ?  ?  ?Body mass index is 23.05 kg/m?. ?  ?WDWN white/ female in NAD   ?Lungs: CTA  ?CV : RRR without murmur   ?Pelvic: tanner stage 5 ,  ?External genitalia: vulva /labia no  lesions ?Urethra: no prolapse ?Vagina: normal physiologic d/c,adequate room for [facility] if need be   ?Cervix: no lesions, no cervical motion tenderness   ?Uterus: normal size shape and contour, non-tender ?Adnexa: no mass,  non-tender   ?Rectovaginal: ?After discussion colposcopic elected for :  ?Colposcopic exam: ?A speculum is placed and 5% acetic acid is applied to the cervix. There is no acetowhite epithelium . There is no puncation .  There is no mosacism. .  An ECC is also done.  ?  ?Impression:  ?  ?Severe cervical dysplasia  ?  ?Plan:  ?Pt has elected for definitive surgical intervention . IE... TVH and BS ?Risks of the procedure have been explained to the pt . All questions answered . See [facility] notes  ?  ?  ? ?  ?  ?  ?Caroline Sauger, MD ?  ?  ?

## 2021-08-09 ENCOUNTER — Other Ambulatory Visit
Admission: RE | Admit: 2021-08-09 | Discharge: 2021-08-09 | Disposition: A | Discharge status: Home / Self Care | Payer: BC Managed Care – PPO | Source: Ambulatory Visit | Attending: Obstetrics and Gynecology | Admitting: Obstetrics and Gynecology

## 2021-08-09 NOTE — Patient Instructions (Signed)
?Your procedure is scheduled on: Thursday May 4th, 2023. ?Report to Day Surgery inside [facility] 2nd floor, stop by admissions desk before getting on elevator.  ?To find out your arrival time please call [phone] between 1PM - 3PM on Wednesday May 3rd, 2023. ? ?Remember: Instructions that are not followed completely may result in serious medical risk,  ?up to and including death, or upon the discretion of your surgeon and anesthesiologist your  ?surgery may need to be rescheduled.  ? ?  _X__ 1. Do not eat food after midnight the night before your procedure. ?                No chewing gum or hard candies. You may drink clear liquids up to 2 hours ?                before you are scheduled to arrive for your surgery- DO not drink clear ?                liquids within 2 hours of the start of your surgery. ?                Clear Liquids include:  water, apple juice without pulp, clear Gatorade, G2 or  ?                Gatorade Zero (avoid Red/Purple/Blue), Black Coffee or Tea (Do not add ?                anything to coffee or tea). ? ?__X__2.   Complete the "Ensure Clear Pre-surgery Clear Carbohydrate Drink" provided to you, 2 hours before arrival. **If you are diabetic you will be provided with an alternative drink, Gatorade Zero or G2. ? ?__X__3.  On the morning of surgery brush your teeth with toothpaste and water, you ?               may rinse your mouth with mouthwash if you wish.  Do not swallow any toothpaste of mouthwash. ?   ? _X__ 4.  No Alcohol for 24 hours before or after surgery. ? ? _X__ 5.  Do Not Smoke or use e-cigarettes For 24 Hours Prior to Your Surgery. ?                Do not use any chewable tobacco products for at least 6 hours prior to ?                Surgery. ? ?_X__  5.  Do not use any recreational drugs (marijuana, cocaine, heroin, ecstasy, MDMA or other) ?               For at least one week prior to your surgery.  Combination of these drugs with anesthesia ?                May have life threatening results. ? ?____  6.  Bring all medications with you on the day of surgery if instructed.  ? ?_X__  7.  Notify your doctor if there is any change in your medical condition  ?    (cold, fever, infections). ?    ?Do not wear jewelry, make-up, hairpins, clips or nail polish. ?Do not wear lotions, powders, or perfumes. You may wear deodorant. ?Do not shave 48 hours prior to surgery. Men may shave face and neck. ?Do not bring valuables to the hospital.   ? ?[facility] is not responsible for any belongings or valuables. ? ?  Contacts, dentures or bridgework may not be worn into surgery. ?Leave your suitcase in the car. After surgery it may be brought to your room. ?For patients admitted to the hospital, discharge time is determined by your ?treatment team. ?  ?Patients discharged the day of surgery will not be allowed to drive home.   ?Make arrangements for someone to be with you for the first 24 hours of your ?Same Day Discharge. ? ? ?__X__ Take these medicines the morning of surgery with A SIP OF WATER:  ? ? 1. None  ? 2.  ? 3.  ? 4. ? 5. ? 6. ? ?____ Fleet Enema (as directed)  ? ?____ Use CHG Soap (or wipes) as directed ? ?____ Use Benzoyl Peroxide Gel as instructed ? ?____ Use inhalers on the day of surgery ? ?____ Stop metformin 2 days prior to surgery   ? ?____ Take 1/2 of usual insulin dose the night before surgery. No insulin the morning ?         of surgery.  ? ?____ Call your PCP, cardiologist, or Pulmonologist if taking Coumadin/Plavix/aspirin and ask when to stop before your surgery.  ? ?__X__ One Week prior to surgery- Stop Anti-inflammatories such as Ibuprofen, Aleve, Advil, Motrin, meloxicam (MOBIC), diclofenac, etodolac, ketorolac, Toradol, Daypro, piroxicam, Goody's or BC powders. OK TO USE TYLENOL IF NEEDED ?  ?__X__ One week prior to surgery-stop ALL supplements until after surgery.   ? ?____ Bring C-Pap to the hospital.  ? ? ?If you have any questions regarding your  pre-procedure instructions,  ?Please call Pre-admit Testing at [phone] ? ?

## 2021-08-10 ENCOUNTER — Encounter
Admission: RE | Admit: 2021-08-10 | Discharge: 2021-08-10 | Disposition: A | Discharge status: Home / Self Care | Payer: BC Managed Care – PPO | Source: Ambulatory Visit | Attending: Obstetrics and Gynecology | Admitting: Obstetrics and Gynecology

## 2021-08-10 DIAGNOSIS — I10 Essential (primary) hypertension: Secondary | ICD-10-CM | POA: Insufficient documentation

## 2021-08-10 DIAGNOSIS — Z01818 Encounter for other preprocedural examination: Secondary | ICD-10-CM | POA: Diagnosis present

## 2021-08-10 LAB — TYPE AND SCREEN
ABO/RH(D): A POS
Antibody Screen: NEGATIVE

## 2021-08-10 LAB — CBC
HCT: 44.5 % (ref 36.0–46.0)
Hemoglobin: 14.5 g/dL (ref 12.0–15.0)
MCH: 30 pg (ref 26.0–34.0)
MCHC: 32.6 g/dL (ref 30.0–36.0)
MCV: 92.1 fL (ref 80.0–100.0)
Platelets: 194 10*3/uL (ref 150–400)
RBC: 4.83 MIL/uL (ref 3.87–5.11)
RDW: 13.9 % (ref 11.5–15.5)
WBC: 10.1 10*3/uL (ref 4.0–10.5)
nRBC: 0 % (ref 0.0–0.2)

## 2021-08-10 LAB — BASIC METABOLIC PANEL
Anion gap: 6 (ref 5–15)
BUN: 17 mg/dL (ref 6–20)
CO2: 30 mmol/L (ref 22–32)
Calcium: 9.7 mg/dL (ref 8.9–10.3)
Chloride: 104 mmol/L (ref 98–111)
Creatinine, Ser: 0.81 mg/dL (ref 0.44–1.00)
GFR, Estimated: >60 mL/min (ref >60)
Glucose, Bld: 94 mg/dL (ref 70–99)
Potassium: 4 mmol/L (ref 3.5–5.1)
Sodium: 140 mmol/L (ref 135–145)

## 2021-08-11 LAB — RPR
RPR Ser Ql: REACTIVE — AB
RPR Titer: 1:2 {titer}

## 2021-08-14 LAB — T.PALLIDUM AB, TOTAL: T Pallidum Abs: NONREACTIVE

## 2021-08-16 NOTE — Anesthesia Preprocedure Evaluation (Addendum)
Anesthesia Evaluation  ?Patient identified by MRN, date of birth, ID band ?Patient awake ? ? ? ?Reviewed: ?Allergy & Precautions, NPO status , Patient's Chart, lab work & pertinent test results ? ?Airway ?Mallampati: II ? ?TM Distance: >3 FB ?Neck ROM: Full ? ? ? Dental ? ?(+) Teeth Intact ?  ?Pulmonary ?COPD: last inhaler 2 weeks ago., Current Smoker and Patient abstained from smoking.,  ?  ?Pulmonary exam normal ? ? ? ? ? ? ? Cardiovascular ?Exercise Tolerance: Good ?hypertension, Pt. on medications ?Normal cardiovascular exam ? ? ?  ?Neuro/Psych ? Headaches,   ? GI/Hepatic ?negative GI ROS, Neg liver ROS, GERD: Tums only.  ,  ?Endo/Other  ?negative endocrine ROS ? Renal/GU ?negative Renal ROS  ? ?  ?Musculoskeletal ?negative musculoskeletal ROS ?(+)  ? Abdominal ?Normal abdominal exam  (+)   ?Peds ? Hematology ?negative hematology ROS ?(+)   ?Anesthesia Other Findings ?Endocervical CIN3 Severe Dysplasia ? Reproductive/Obstetrics ?negative OB ROS ? ?  ? ? ? ? ? ? ? ? ? ? ? ? ? ?  ?  ? ? ? ? ? ? ?Anesthesia Physical ? ?Anesthesia Plan ? ?ASA: 2 ? ?Anesthesia Plan: General  ? ?Post-op Pain Management: Gabapentin PO (pre-op)*, Toradol IV (intra-op)* and Tylenol PO (pre-op)*  ? ?Induction: Intravenous ? ?PONV Risk Score and Plan: 3 and Ondansetron, Dexamethasone and Midazolam ? ?Airway Management Planned: Oral ETT ? ?Additional Equipment:  ? ?Intra-op Plan:  ? ?Post-operative Plan: Extubation in OR ? ?Informed Consent: I have reviewed the patients History and Physical, chart, labs and discussed the procedure including the risks, benefits and alternatives for the proposed anesthesia with the patient or authorized representative who has indicated his/her understanding and acceptance.  ? ? ? ?Dental advisory given ? ?Plan Discussed with: CRNA and Anesthesiologist ? ?Anesthesia Plan Comments:   ? ? ? ? ? ?Anesthesia Quick Evaluation ? ?

## 2021-08-17 ENCOUNTER — Encounter
Admission: RE | Disposition: A | Discharge status: Home / Self Care | Payer: Self-pay | Source: Home / Self Care | Attending: Obstetrics and Gynecology

## 2021-08-17 ENCOUNTER — Other Ambulatory Visit: Payer: Self-pay

## 2021-08-17 ENCOUNTER — Ambulatory Visit: Payer: BC Managed Care – PPO | Admitting: Anesthesiology

## 2021-08-17 ENCOUNTER — Ambulatory Visit
Admission: RE | Admit: 2021-08-17 | Discharge: 2021-08-17 | Disposition: A | Discharge status: Home / Self Care | Payer: BC Managed Care – PPO | Attending: Obstetrics and Gynecology | Admitting: Obstetrics and Gynecology

## 2021-08-17 ENCOUNTER — Encounter: Payer: Self-pay | Admitting: Obstetrics and Gynecology

## 2021-08-17 DIAGNOSIS — Z79899 Other long term (current) drug therapy: Secondary | ICD-10-CM | POA: Insufficient documentation

## 2021-08-17 DIAGNOSIS — D259 Leiomyoma of uterus, unspecified: Secondary | ICD-10-CM | POA: Insufficient documentation

## 2021-08-17 DIAGNOSIS — J449 Chronic obstructive pulmonary disease, unspecified: Secondary | ICD-10-CM | POA: Insufficient documentation

## 2021-08-17 DIAGNOSIS — F17210 Nicotine dependence, cigarettes, uncomplicated: Secondary | ICD-10-CM | POA: Diagnosis not present

## 2021-08-17 DIAGNOSIS — G43909 Migraine, unspecified, not intractable, without status migrainosus: Secondary | ICD-10-CM | POA: Insufficient documentation

## 2021-08-17 DIAGNOSIS — D060 Carcinoma in situ of endocervix: Secondary | ICD-10-CM | POA: Diagnosis present

## 2021-08-17 DIAGNOSIS — R87810 Cervical high risk human papillomavirus (HPV) DNA test positive: Secondary | ICD-10-CM | POA: Insufficient documentation

## 2021-08-17 DIAGNOSIS — Z01818 Encounter for other preprocedural examination: Secondary | ICD-10-CM

## 2021-08-17 DIAGNOSIS — I10 Essential (primary) hypertension: Secondary | ICD-10-CM | POA: Diagnosis not present

## 2021-08-17 DIAGNOSIS — K219 Gastro-esophageal reflux disease without esophagitis: Secondary | ICD-10-CM | POA: Insufficient documentation

## 2021-08-17 HISTORY — PX: VAGINAL HYSTERECTOMY: SHX2639

## 2021-08-17 HISTORY — PX: BILATERAL SALPINGECTOMY: SHX5743

## 2021-08-17 LAB — POCT PREGNANCY, URINE
Preg Test, Ur: NEGATIVE
Preg Test, Ur: NEGATIVE

## 2021-08-17 LAB — ABO/RH: ABO/RH(D): A POS

## 2021-08-17 SURGERY — HYSTERECTOMY, VAGINAL
Anesthesia: General

## 2021-08-17 MED ORDER — GABAPENTIN 300 MG PO CAPS
ORAL_CAPSULE | ORAL | Status: AC
  Administered 2021-08-17: 300 mg via ORAL
  Filled 2021-08-17: qty 1

## 2021-08-17 MED ORDER — PHENYLEPHRINE HCL-NACL 20-0.9 MG/250ML-% IV SOLN
INTRAVENOUS | Status: AC
  Filled 2021-08-17: qty 250

## 2021-08-17 MED ORDER — DEXAMETHASONE SODIUM PHOSPHATE 10 MG/ML IJ SOLN
INTRAMUSCULAR | Status: AC
  Filled 2021-08-17: qty 1

## 2021-08-17 MED ORDER — LACTATED RINGERS IV SOLN: INTRAVENOUS | Status: DC | PRN

## 2021-08-17 MED ORDER — LIDOCAINE-EPINEPHRINE 1 %-1:100000 IJ SOLN
INTRAMUSCULAR | Status: DC | PRN
  Administered 2021-08-17: 5 mL

## 2021-08-17 MED ORDER — OXYCODONE HCL 5 MG/5ML PO SOLN: 5.0000 mg | Freq: Once | ORAL | Status: AC | PRN

## 2021-08-17 MED ORDER — ACETAMINOPHEN 10 MG/ML IV SOLN: 1000.0000 mg | Freq: Once | INTRAVENOUS | Status: DC | PRN

## 2021-08-17 MED ORDER — KETAMINE HCL 50 MG/5ML IJ SOSY
PREFILLED_SYRINGE | INTRAMUSCULAR | Status: AC
  Filled 2021-08-17: qty 5

## 2021-08-17 MED ORDER — OXYCODONE HCL 5 MG PO TABS
5.0000 mg | ORAL_TABLET | Freq: Once | ORAL | Status: AC | PRN
  Administered 2021-08-17: 5 mg via ORAL

## 2021-08-17 MED ORDER — PHENYLEPHRINE HCL (PRESSORS) 10 MG/ML IV SOLN
INTRAVENOUS | Status: DC | PRN
  Administered 2021-08-17 (×3): 80 ug via INTRAVENOUS
  Administered 2021-08-17: 160 ug via INTRAVENOUS
  Administered 2021-08-17 (×2): 80 ug via INTRAVENOUS
  Administered 2021-08-17: 160 ug via INTRAVENOUS

## 2021-08-17 MED ORDER — FENTANYL CITRATE (PF) 100 MCG/2ML IJ SOLN
25.0000 ug | INTRAMUSCULAR | Status: DC | PRN
  Administered 2021-08-17 (×3): 25 ug via INTRAVENOUS

## 2021-08-17 MED ORDER — KETOROLAC TROMETHAMINE 30 MG/ML IJ SOLN
INTRAMUSCULAR | Status: DC | PRN
Start: 2021-08-17 — End: 2021-08-17
  Administered 2021-08-17: 30 mg via INTRAVENOUS

## 2021-08-17 MED ORDER — LIDOCAINE-EPINEPHRINE (PF) 1 %-1:200000 IJ SOLN
INTRAMUSCULAR | Status: AC
  Filled 2021-08-17: qty 30

## 2021-08-17 MED ORDER — ACETAMINOPHEN 500 MG PO TABS
ORAL_TABLET | ORAL | Status: AC
  Administered 2021-08-17: 1000 mg via ORAL
  Filled 2021-08-17: qty 2

## 2021-08-17 MED ORDER — ACETAMINOPHEN 500 MG PO TABS: 1000.0000 mg | ORAL_TABLET | ORAL | Status: AC

## 2021-08-17 MED ORDER — SUGAMMADEX SODIUM 200 MG/2ML IV SOLN
INTRAVENOUS | Status: DC | PRN
Start: 2021-08-17 — End: 2021-08-17
  Administered 2021-08-17: 200 mg via INTRAVENOUS

## 2021-08-17 MED ORDER — CHLORHEXIDINE GLUCONATE 0.12 % MT SOLN: 15.0000 mL | Freq: Once | OROMUCOSAL | Status: AC

## 2021-08-17 MED ORDER — ONDANSETRON HCL 4 MG/2ML IJ SOLN
INTRAMUSCULAR | Status: DC | PRN
Start: 2021-08-17 — End: 2021-08-17
  Administered 2021-08-17: 4 mg via INTRAVENOUS

## 2021-08-17 MED ORDER — OXYCODONE HCL 5 MG PO TABS
ORAL_TABLET | ORAL | Status: AC
  Filled 2021-08-17: qty 1

## 2021-08-17 MED ORDER — FENTANYL CITRATE (PF) 100 MCG/2ML IJ SOLN
INTRAMUSCULAR | Status: DC | PRN
  Administered 2021-08-17 (×2): 50 ug via INTRAVENOUS

## 2021-08-17 MED ORDER — ROCURONIUM BROMIDE 100 MG/10ML IV SOLN
INTRAVENOUS | Status: DC | PRN
  Administered 2021-08-17: 50 mg via INTRAVENOUS

## 2021-08-17 MED ORDER — ORAL CARE MOUTH RINSE: 15.0000 mL | Freq: Once | OROMUCOSAL | Status: AC

## 2021-08-17 MED ORDER — PROPOFOL 10 MG/ML IV BOLUS
INTRAVENOUS | Status: AC
  Filled 2021-08-17: qty 40

## 2021-08-17 MED ORDER — LACTATED RINGERS IV SOLN: INTRAVENOUS | Status: DC

## 2021-08-17 MED ORDER — OXYCODONE HCL 5 MG PO TABS: 5.0000 mg | ORAL_TABLET | ORAL | Status: DC | PRN

## 2021-08-17 MED ORDER — FENTANYL CITRATE (PF) 100 MCG/2ML IJ SOLN
INTRAMUSCULAR | Status: AC
  Administered 2021-08-17: 25 ug via INTRAVENOUS
  Filled 2021-08-17: qty 2

## 2021-08-17 MED ORDER — MIDAZOLAM HCL 2 MG/2ML IJ SOLN
INTRAMUSCULAR | Status: DC | PRN
  Administered 2021-08-17: 2 mg via INTRAVENOUS

## 2021-08-17 MED ORDER — CEFAZOLIN SODIUM-DEXTROSE 2-4 GM/100ML-% IV SOLN
INTRAVENOUS | Status: AC
  Filled 2021-08-17: qty 100

## 2021-08-17 MED ORDER — MIDAZOLAM HCL 2 MG/2ML IJ SOLN
INTRAMUSCULAR | Status: AC
  Filled 2021-08-17: qty 2

## 2021-08-17 MED ORDER — LIDOCAINE HCL (CARDIAC) PF 100 MG/5ML IV SOSY
PREFILLED_SYRINGE | INTRAVENOUS | Status: DC | PRN
Start: 2021-08-17 — End: 2021-08-17
  Administered 2021-08-17: 60 mg via INTRAVENOUS

## 2021-08-17 MED ORDER — KETAMINE HCL 10 MG/ML IJ SOLN
INTRAMUSCULAR | Status: DC | PRN
  Administered 2021-08-17: 25 mg via INTRAVENOUS

## 2021-08-17 MED ORDER — PROMETHAZINE HCL 25 MG/ML IJ SOLN: 6.2500 mg | INTRAMUSCULAR | Status: DC | PRN

## 2021-08-17 MED ORDER — LIDOCAINE-EPINEPHRINE 1 %-1:100000 IJ SOLN
INTRAMUSCULAR | Status: AC
  Filled 2021-08-17: qty 1

## 2021-08-17 MED ORDER — CHLORHEXIDINE GLUCONATE 0.12 % MT SOLN
OROMUCOSAL | Status: AC
  Administered 2021-08-17: 15 mL via OROMUCOSAL
  Filled 2021-08-17: qty 15

## 2021-08-17 MED ORDER — ONDANSETRON 4 MG PO TBDP: 4.0000 mg | ORAL_TABLET | Freq: Four times a day (QID) | ORAL | Status: DC | PRN

## 2021-08-17 MED ORDER — ROCURONIUM BROMIDE 10 MG/ML (PF) SYRINGE
PREFILLED_SYRINGE | INTRAVENOUS | Status: AC
  Filled 2021-08-17: qty 10

## 2021-08-17 MED ORDER — GABAPENTIN 300 MG PO CAPS: 300.0000 mg | ORAL_CAPSULE | ORAL | Status: AC

## 2021-08-17 MED ORDER — FAMOTIDINE 20 MG PO TABS
ORAL_TABLET | ORAL | Status: AC
  Administered 2021-08-17: 20 mg via ORAL
  Filled 2021-08-17: qty 1

## 2021-08-17 MED ORDER — DROPERIDOL 2.5 MG/ML IJ SOLN: 0.6250 mg | Freq: Once | INTRAMUSCULAR | Status: DC | PRN

## 2021-08-17 MED ORDER — PROPOFOL 10 MG/ML IV BOLUS
INTRAVENOUS | Status: DC | PRN
  Administered 2021-08-17: 200 mg via INTRAVENOUS

## 2021-08-17 MED ORDER — ONDANSETRON HCL 4 MG/2ML IJ SOLN
INTRAMUSCULAR | Status: AC
  Filled 2021-08-17: qty 2

## 2021-08-17 MED ORDER — 0.9 % SODIUM CHLORIDE (POUR BTL) OPTIME
TOPICAL | Status: DC | PRN
Start: 2021-08-17 — End: 2021-08-17
  Administered 2021-08-17: 1000 mL

## 2021-08-17 MED ORDER — LIDOCAINE HCL (PF) 2 % IJ SOLN
INTRAMUSCULAR | Status: AC
  Filled 2021-08-17: qty 5

## 2021-08-17 MED ORDER — FENTANYL CITRATE (PF) 100 MCG/2ML IJ SOLN
INTRAMUSCULAR | Status: AC
  Filled 2021-08-17: qty 2

## 2021-08-17 MED ORDER — POVIDONE-IODINE 10 % EX SWAB
2.0000 "application " | Freq: Once | CUTANEOUS | Status: AC
  Administered 2021-08-17: 2 via TOPICAL

## 2021-08-17 MED ORDER — DEXAMETHASONE SODIUM PHOSPHATE 10 MG/ML IJ SOLN
INTRAMUSCULAR | Status: DC | PRN
  Administered 2021-08-17: 10 mg via INTRAVENOUS

## 2021-08-17 MED ORDER — FAMOTIDINE 20 MG PO TABS: 20.0000 mg | ORAL_TABLET | Freq: Once | ORAL | Status: AC

## 2021-08-17 MED ORDER — CEFAZOLIN SODIUM-DEXTROSE 2-4 GM/100ML-% IV SOLN
2.0000 g | Freq: Once | INTRAVENOUS | Status: AC
Start: 2021-08-17 — End: 2021-08-17
  Administered 2021-08-17: 2 g via INTRAVENOUS

## 2021-08-17 SURGICAL SUPPLY — 38 items
BACTOSHIELD CHG 4% 4OZ (MISCELLANEOUS) ×1
BAG DRN RND TRDRP ANRFLXCHMBR (UROLOGICAL SUPPLIES) ×2
BAG URINE DRAIN 2000ML AR STRL (UROLOGICAL SUPPLIES) ×4 IMPLANT
CATH FOLEY 2WAY  5CC 16FR (CATHETERS) ×3
CATH FOLEY 2WAY 5CC 16FR (CATHETERS) ×2
CATH ROBINSON RED A/P 16FR (CATHETERS) ×4 IMPLANT
CATH URTH 16FR FL 2W BLN LF (CATHETERS) ×3 IMPLANT
DRAPE PERI LITHO V/GYN (MISCELLANEOUS) ×4 IMPLANT
DRAPE UNDER BUTTOCK W/FLU (DRAPES) ×4 IMPLANT
ELECT REM PT RETURN 9FT ADLT (ELECTROSURGICAL) ×3
ELECTRODE REM PT RTRN 9FT ADLT (ELECTROSURGICAL) ×3 IMPLANT
GAUZE 4X4 16PLY ~~LOC~~+RFID DBL (SPONGE) ×8 IMPLANT
GLOVE SURG SYN 8.0 (GLOVE) ×3 IMPLANT
GLOVE SURG SYN 8.0 PF PI (GLOVE) ×3 IMPLANT
GOWN STRL REUS W/ TWL LRG LVL3 (GOWN DISPOSABLE) ×9 IMPLANT
GOWN STRL REUS W/ TWL XL LVL3 (GOWN DISPOSABLE) ×3 IMPLANT
GOWN STRL REUS W/TWL LRG LVL3 (GOWN DISPOSABLE) ×9
GOWN STRL REUS W/TWL XL LVL3 (GOWN DISPOSABLE) ×3
KIT TURNOVER CYSTO (KITS) ×4 IMPLANT
LABEL OR SOLS (LABEL) ×4 IMPLANT
MANIFOLD NEPTUNE II (INSTRUMENTS) ×4 IMPLANT
NEEDLE HYPO 22GX1.5 SAFETY (NEEDLE) ×4 IMPLANT
PACK BASIN MINOR ARMC (MISCELLANEOUS) ×4 IMPLANT
PAD OB MATERNITY 4.3X12.25 (PERSONAL CARE ITEMS) ×4 IMPLANT
PAD PREP 24X41 OB/GYN DISP (PERSONAL CARE ITEMS) ×4 IMPLANT
SCRUB CHG 4% DYNA-HEX 4OZ (MISCELLANEOUS) ×3 IMPLANT
SOL PREP PVP 2OZ (MISCELLANEOUS) ×3
SOLUTION PREP PVP 2OZ (MISCELLANEOUS) ×3 IMPLANT
SURGILUBE 2OZ TUBE FLIPTOP (MISCELLANEOUS) ×4 IMPLANT
SUT VIC AB 0 CT1 27 (SUTURE) ×9
SUT VIC AB 0 CT1 27XCR 8 STRN (SUTURE) ×6 IMPLANT
SUT VIC AB 0 CT1 36 (SUTURE) ×4 IMPLANT
SUT VIC AB 2-0 SH 27 (SUTURE) ×3
SUT VIC AB 2-0 SH 27XBRD (SUTURE) ×3 IMPLANT
SYR 10ML LL (SYRINGE) ×4 IMPLANT
SYR CONTROL 10ML LL (SYRINGE) ×4 IMPLANT
WATER STERILE IRR 1000ML POUR (IV SOLUTION) ×4 IMPLANT
WATER STERILE IRR 500ML POUR (IV SOLUTION) ×4 IMPLANT

## 2021-08-17 NOTE — Brief Op Note (Signed)
08/17/2021 ? ?11:04 AM ? ?PATIENT:  Kayla Mcintosh  51 y.o. female ? ?PRE-OPERATIVE DIAGNOSIS:  Endocervical CIN3 Severe Dysplasia ? ?POST-OPERATIVE DIAGNOSIS:  Endocervical CIN3 Severe Dysplasia ? ?PROCEDURE:  Procedure(s): ?HYSTERECTOMY VAGINAL (N/A) ?BILATERAL SALPINGECTOMY (Bilateral) ? ?SURGEON:  Surgeon(s) and Role: ?   * Serapio Edelson, Gwen Her, MD - Primary ?   Benjaman Kindler, MD - Assisting ? ?PHYSICIAN ASSISTANT:  ? ?ASSISTANTS: cst  ? ?ANESTHESIA:   general ? IOF 800 uo 100cc ? ?BLOOD ADMINISTERED:none ? ?DRAINS: none  ? ?LOCAL MEDICATIONS USED:  LIDOCAINE  ? ?SPECIMEN:  Source of Specimen:  cervix , Uterus, and bilateral fallopian tubes  ? ?DISPOSITION OF SPECIMEN:  PATHOLOGY ? ?COUNTS:  YES ? ?TOURNIQUET:  * No tourniquets in log * ? ?DICTATION: .Other Dictation: Dictation Number verbal  ? ?PLAN OF CARE: Discharge to home after PACU ? ?PATIENT DISPOSITION:  PACU - hemodynamically stable. ?  ?Delay start of Pharmacological VTE agent (>24hrs) due to surgical blood loss or risk of bleeding: not applicable ? ?

## 2021-08-17 NOTE — Anesthesia Procedure Notes (Signed)
Procedure Name: Intubation ?Date/Time: 08/17/2021 9:58 AM ?Performed by: Loletha Grayer, CRNA ?Pre-anesthesia Checklist: Patient identified, Patient being monitored, Timeout performed, Emergency Drugs available and Suction available ?Patient Re-evaluated:Patient Re-evaluated prior to induction ?Oxygen Delivery Method: Circle system utilized ?Preoxygenation: Pre-oxygenation with 100% oxygen ?Induction Type: IV induction ?Ventilation: Mask ventilation without difficulty ?Laryngoscope Size: Mac and 3 ?Grade View: Grade I ?Tube type: Oral ?Tube size: 7.0 mm ?Number of attempts: 1 ?Airway Equipment and Method: Stylet ?Placement Confirmation: ETT inserted through vocal cords under direct vision, positive ETCO2 and breath sounds checked- equal and bilateral ?Secured at: 21 cm ?Tube secured with: Tape ?Dental Injury: Teeth and Oropharynx as per pre-operative assessment  ? ? ? ? ?

## 2021-08-17 NOTE — Discharge Instructions (Signed)

## 2021-08-17 NOTE — Progress Notes (Signed)
Pt here for [facility] and BS for endocervical dysplasia , severe ?NPO . LAbs reviewed . All questions answered . Proceed  ?

## 2021-08-17 NOTE — Transfer of Care (Signed)
Immediate Anesthesia Transfer of Care Note ? ?Patient: Kayla Mcintosh ? ?Procedure(s) Performed: HYSTERECTOMY VAGINAL ?BILATERAL SALPINGECTOMY (Bilateral) ? ?Patient Location: PACU ? ?Anesthesia Type:General ? ?Level of Consciousness: drowsy ? ?Airway & Oxygen Therapy: Patient Spontanous Breathing and Patient connected to face mask oxygen ? ?Post-op Assessment: Report given to RN and Post -op Vital signs reviewed and stable ? ?Post vital signs: Reviewed and stable ? ?Last Vitals:  ?Vitals Value Taken Time  ?BP 119/71 08/17/21 1115  ?Temp    ?Pulse 67 08/17/21 1115  ?Resp 14 08/17/21 1115  ?SpO2 100 % 08/17/21 1115  ? ? ?Last Pain:  ?Vitals:  ? 08/17/21 0805  ?TempSrc: Oral  ?PainSc: 0-No pain  ?   ? ?  ? ?Complications: No notable events documented. ?

## 2021-08-18 NOTE — Anesthesia Postprocedure Evaluation (Signed)
Anesthesia Post Note ? ?Patient: Kayla Mcintosh ? ?Procedure(s) Performed: HYSTERECTOMY VAGINAL ?BILATERAL SALPINGECTOMY (Bilateral) ? ?Patient location during evaluation: PACU ?Anesthesia Type: General ?Level of consciousness: awake and alert ?Pain management: pain level controlled ?Vital Signs Assessment: post-procedure vital signs reviewed and stable ?Respiratory status: spontaneous breathing, nonlabored ventilation and respiratory function stable ?Cardiovascular status: blood pressure returned to baseline and stable ?Postop Assessment: no apparent nausea or vomiting ?Anesthetic complications: no ? ? ?No notable events documented. ? ? ?Last Vitals:  ?Vitals:  ? 08/17/21 1213 08/17/21 1224  ?BP: (!) 144/95 (!) 149/92  ?Pulse: 74 76  ?Resp: 20 14  ?Temp: 36.9 ?C 36.7 ?C  ?SpO2: 95% 97%  ?  ?Last Pain:  ?Vitals:  ? 08/18/21 0906  ?TempSrc:   ?PainSc: 0-No pain  ? ? ?  ?  ?  ?  ?  ?  ? ?[place] Ouch ? ? ? ? ?

## 2021-08-18 NOTE — Op Note (Signed)
NAME: Kayla Mcintosh, Kayla Mcintosh ?MEDICAL RECORD NO: [id number] ?ACCOUNT NO: [id number] ?DATE OF BIRTH: Jan 10, 1971 ?FACILITY: [facility] ?LOCATION: [facility] ?PHYSICIAN: Boykin Nearing, MD ? ?Operative Report  ? ?DATE OF PROCEDURE: 08/17/2021 ? ?PREOPERATIVE DIAGNOSIS:  Severe endocervical dysplasia. ? ?POSTOPERATIVE DIAGNOSIS:  Severe endocervical dysplasia. ? ?PROCEDURE:  Total vaginal hysterectomy, bilateral salpingectomy. ? ?ANESTHESIA:  General endotracheal anesthesia. ? ?SURGEON:  Boykin Nearing, MD ? ?FIRST ASSISTANT:  Benjaman Kindler, MD ? ?INDICATIONS:  A 51 year old gravida 1, para 1 patient with a Pap smear with ASCUS and high-risk HPV, who underwent a colposcopic evaluation and an endocervical curettage.  ECC showed CIN-3.  The patient elects for definitive surgery. ? ?DESCRIPTION OF PROCEDURE:  After adequate general endotracheal anesthesia, the patient was placed in dorsal supine position with the legs in the candy cane stirrups.  The patient's lower abdomen, perineum and vagina were prepped and draped in normal  ?sterile fashion.  Timeout was performed.  The patient did receive 2 grams of IV Ancef prior to commencement of the case.  A weighted speculum was placed in the posterior vaginal vault and the cervix was grasped with 2 thyroid tenacula.  Straight  ?catheterization of the bladder yielded 100 mL clear urine.  Cervix was then circumferentially injected with 1% lidocaine with 1:100,000 epinephrine.  A direct posterior colpotomy incision was made.  Upon entry into the posterior cul-de-sac, a weighted  ?long-billed speculum was placed.  The uterosacral ligaments were then bilaterally clamped, transected and suture ligated with 0 Vicryl suture and tagged for later identification.  Anterior cervix was circumferentially incised with the Bovie.  The  ?cardinal ligaments were bilaterally clamped, transected and suture ligated with 0 Vicryl suture.  Anterior cul-de-sac was entered sharply and the Deaver  retractor was placed within to elevate the bladder anteriorly.  Uterine arteries were then  ?bilaterally clamped, transected and suture ligated with 0 Vicryl suture.  A cornual clamp was then placed on both sides and the uterus and cervix were delivered, each pedicle was doubly ligated with 0 Vicryl suture.  Each fallopian tube was grasped and  ?distal portion of fallopian tube was clamped and removed and secured with 0 Vicryl suture.  Good hemostasis noted.  The peritoneum was then closed with a 2-0 PDS suture in a pursestring fashion.  The vaginal cuff was then closed vertically with 0 Vicryl  ?suture running nonlocking.  The uterosacral ligaments were plicated centrally and the rest of the cuff was closed with the 0 Vicryl suture.  Straight catheterization of the bladder yielded 5 mL additional urine at the end of the case.  Good hemostasis  ?was noted.  There were no complications. ? ?ESTIMATED BLOOD LOSS:  10 mL ? ?INTRAOPERATIVE FLUIDS:  800 mL ? ?URINE OUTPUT:  105 mL ? ?The patient was taken to recovery room in good condition. ? ? ?PAA ?D: 08/17/2021 11:35:20 am T: 08/18/2021 1:54:00 am  ?JOB: [id number]/ [id number]  ?

## 2021-08-21 LAB — SURGICAL PATHOLOGY

## 2022-07-03 ENCOUNTER — Other Ambulatory Visit: Payer: Self-pay | Admitting: Family Medicine

## 2022-07-03 DIAGNOSIS — Z1231 Encounter for screening mammogram for malignant neoplasm of breast: Secondary | ICD-10-CM

## 2023-08-19 ENCOUNTER — Other Ambulatory Visit (HOSPITAL_COMMUNITY): Payer: Self-pay

## 2023-08-27 ENCOUNTER — Other Ambulatory Visit
Admission: RE | Admit: 2023-08-27 | Discharge: 2023-08-27 | Disposition: A | Discharge status: Home / Self Care | Source: Ambulatory Visit | Attending: Family Medicine | Admitting: Family Medicine

## 2023-08-27 DIAGNOSIS — I10 Essential (primary) hypertension: Secondary | ICD-10-CM | POA: Insufficient documentation

## 2023-08-27 LAB — CBC WITH DIFFERENTIAL/PLATELET
Abs Immature Granulocytes: 0.03 10*3/uL (ref 0.00–0.07)
Basophils Absolute: 0.1 10*3/uL (ref 0.0–0.1)
Basophils Relative: 0 %
Eosinophils Absolute: 0.1 10*3/uL (ref 0.0–0.5)
Eosinophils Relative: 1 %
HCT: 43.2 % (ref 36.0–46.0)
Hemoglobin: 14.3 g/dL (ref 12.0–15.0)
Immature Granulocytes: 0 %
Lymphocytes Relative: 50 %
Lymphs Abs: 6.3 10*3/uL — ABNORMAL HIGH (ref 0.7–4.0)
MCH: 30.5 pg (ref 26.0–34.0)
MCHC: 33.1 g/dL (ref 30.0–36.0)
MCV: 92.1 fL (ref 80.0–100.0)
Monocytes Absolute: 1.4 10*3/uL — ABNORMAL HIGH (ref 0.1–1.0)
Monocytes Relative: 11 %
Neutro Abs: 4.9 10*3/uL (ref 1.7–7.7)
Neutrophils Relative %: 38 %
Platelets: 174 10*3/uL (ref 150–400)
RBC: 4.69 MIL/uL (ref 3.87–5.11)
RDW: 14.8 % (ref 11.5–15.5)
WBC: 12.8 10*3/uL — ABNORMAL HIGH (ref 4.0–10.5)
nRBC: 0 % (ref 0.0–0.2)

## 2024-01-22 ENCOUNTER — Encounter: Payer: Self-pay | Admitting: Gastroenterology

## 2024-01-23 ENCOUNTER — Encounter: Payer: Self-pay | Admitting: Gastroenterology

## 2024-01-23 NOTE — Anesthesia Preprocedure Evaluation (Addendum)
 Anesthesia Evaluation  Patient identified by MRN, date of birth, ID band Patient awake    Reviewed: Allergy & Precautions, H&P , NPO status , Patient's Chart, lab work & pertinent test results  Airway Mallampati: III  TM Distance: <3 FB Neck ROM: Full    Dental no notable dental hx. (+) Upper Dentures   Pulmonary neg pulmonary ROS, Current Smoker and Patient abstained from smoking.   Pulmonary exam normal breath sounds clear to auscultation       Cardiovascular hypertension, negative cardio ROS Normal cardiovascular exam Rhythm:Regular Rate:Normal     Neuro/Psych  Headaches negative neurological ROS  negative psych ROS   GI/Hepatic negative GI ROS, Neg liver ROS,,,  Endo/Other  negative endocrine ROS    Renal/GU negative Renal ROS  negative genitourinary   Musculoskeletal negative musculoskeletal ROS (+)    Abdominal   Peds negative pediatric ROS (+)  Hematology negative hematology ROS (+)   Anesthesia Other Findings HTN bronchitis  Reproductive/Obstetrics negative OB ROS                              Anesthesia Physical Anesthesia Plan  ASA: 2  Anesthesia Plan: General   Post-op Pain Management:    Induction: Intravenous  PONV Risk Score and Plan:   Airway Management Planned: Natural Airway and Nasal Cannula  Additional Equipment:   Intra-op Plan:   Post-operative Plan:   Informed Consent: I have reviewed the patients History and Physical, chart, labs and discussed the procedure including the risks, benefits and alternatives for the proposed anesthesia with the patient or authorized representative who has indicated his/her understanding and acceptance.     Dental Advisory Given  Plan Discussed with: Anesthesiologist, CRNA and Surgeon  Anesthesia Plan Comments: (Patient consented for risks of anesthesia including but not limited to:  - adverse reactions to  medications - risk of airway placement if required - damage to eyes, teeth, lips or other oral mucosa - nerve damage due to positioning  - sore throat or hoarseness - Damage to heart, brain, nerves, lungs, other parts of body or loss of life  Patient voiced understanding and assent.)         Anesthesia Quick Evaluation

## 2024-01-27 ENCOUNTER — Other Ambulatory Visit: Payer: Self-pay

## 2024-01-27 ENCOUNTER — Ambulatory Visit
Admission: RE | Admit: 2024-01-27 | Discharge: 2024-01-27 | Disposition: A | Discharge status: Home / Self Care | Attending: Gastroenterology | Admitting: Gastroenterology

## 2024-01-27 ENCOUNTER — Encounter: Payer: Self-pay | Admitting: Gastroenterology

## 2024-01-27 ENCOUNTER — Ambulatory Visit: Payer: Self-pay | Admitting: Anesthesiology

## 2024-01-27 ENCOUNTER — Encounter
Admission: RE | Disposition: A | Discharge status: Home / Self Care | Payer: Self-pay | Source: Home / Self Care | Attending: Gastroenterology

## 2024-01-27 DIAGNOSIS — F17210 Nicotine dependence, cigarettes, uncomplicated: Secondary | ICD-10-CM | POA: Insufficient documentation

## 2024-01-27 DIAGNOSIS — Z1211 Encounter for screening for malignant neoplasm of colon: Secondary | ICD-10-CM | POA: Insufficient documentation

## 2024-01-27 DIAGNOSIS — I10 Essential (primary) hypertension: Secondary | ICD-10-CM | POA: Insufficient documentation

## 2024-01-27 HISTORY — DX: Bronchitis, not specified as acute or chronic: J40

## 2024-01-27 HISTORY — PX: COLONOSCOPY: SHX5424

## 2024-01-27 SURGERY — COLONOSCOPY
Anesthesia: General | Site: Rectum

## 2024-01-27 MED ORDER — STERILE WATER FOR IRRIGATION IR SOLN
Status: DC | PRN
  Administered 2024-01-27: 500 mL

## 2024-01-27 MED ORDER — PROPOFOL 10 MG/ML IV BOLUS
INTRAVENOUS | Status: DC | PRN
  Administered 2024-01-27: 30 mg via INTRAVENOUS
  Administered 2024-01-27: 60 mg via INTRAVENOUS
  Administered 2024-01-27: 40 mg via INTRAVENOUS
  Administered 2024-01-27: 60 mg via INTRAVENOUS

## 2024-01-27 MED ORDER — SODIUM CHLORIDE 0.9 % IV SOLN: INTRAVENOUS | Status: DC

## 2024-01-27 MED ORDER — LACTATED RINGERS IV SOLN: INTRAVENOUS | Status: DC

## 2024-01-27 MED ORDER — LIDOCAINE HCL (CARDIAC) PF 100 MG/5ML IV SOSY
PREFILLED_SYRINGE | INTRAVENOUS | Status: DC | PRN
  Administered 2024-01-27: 50 mg via INTRAVENOUS

## 2024-01-27 SURGICAL SUPPLY — 16 items
CLIP HMST 235XBRD CATH ROT (MISCELLANEOUS) IMPLANT
ELECTRODE REM PT RTRN 9FT ADLT (ELECTROSURGICAL) IMPLANT
FORCEPS BIOP RAD 4 LRG CAP 4 (CUTTING FORCEPS) IMPLANT
FORCEPS ESCP3.2XJMB 240X2.8X (MISCELLANEOUS) IMPLANT
GAUZE SPONGE 4X4 12PLY STRL (GAUZE/BANDAGES/DRESSINGS) IMPLANT
GOWN CVR UNV OPN BCK APRN NK (MISCELLANEOUS) ×4 IMPLANT
INJECTOR VARIJECT VIN23 (MISCELLANEOUS) IMPLANT
KIT DEFENDO VALVE AND CONN (KITS) IMPLANT
KIT PROCEDURE OLYMPUS (MISCELLANEOUS) ×2 IMPLANT
MANIFOLD NEPTUNE II (INSTRUMENTS) ×2 IMPLANT
MARKER SPOT ENDO TATTOO 5ML (MISCELLANEOUS) IMPLANT
PROBE APC STR FIRE (PROBE) IMPLANT
RETRIEVER NET ROTH 2.5X230 LF (MISCELLANEOUS) IMPLANT
SNARE COLD EXACTO (MISCELLANEOUS) IMPLANT
TRAP ETRAP POLY (MISCELLANEOUS) IMPLANT
WATER STERILE IRR 250ML POUR (IV SOLUTION) ×2 IMPLANT

## 2024-01-27 NOTE — Op Note (Signed)
 [facility] Patient Name: Kayla Mcintosh Procedure Date: 01/27/2024 9:14 AM MRN: [id number] Account #: [id number] Date of Birth: 07-26-1970 Admit Type: Outpatient Age: 53 Room: [facility] OR ROOM 01 Gender: Female Note Status: Finalized Instrument Name: Peds Colonoscope [id number] Procedure:             Colonoscopy Indications:           Screening for colorectal malignant neoplasm, This is                         the patient's first colonoscopy Providers:             Corinn Jess Brooklyn MD, MD Referring MD:          Maryl Sayres (Referring MD) Medicines:             General Anesthesia Complications:         No immediate complications. Estimated blood loss: None. Procedure:             Pre-Anesthesia Assessment:                        - Prior to the procedure, a History and Physical was                         performed, and patient medications and allergies were                         reviewed. The patient is competent. The risks and                         benefits of the procedure and the sedation options and                         risks were discussed with the patient. All questions                         were answered and informed consent was obtained.                         Patient identification and proposed procedure were                         verified by the physician, the nurse, the                         anesthesiologist, the anesthetist and the technician                         in the pre-procedure area in the procedure room in the                         endoscopy suite. Mental Status Examination: alert and                         oriented. Airway Examination: normal oropharyngeal                         airway and neck mobility. Respiratory Examination:  clear to auscultation. CV Examination: normal.                         Prophylactic Antibiotics: The patient does not require                          prophylactic antibiotics. Prior Anticoagulants: The                         patient has taken no anticoagulant or antiplatelet                         agents. ASA Grade Assessment: II - A patient with mild                         systemic disease. After reviewing the risks and                         benefits, the patient was deemed in satisfactory                         condition to undergo the procedure. The anesthesia                         plan was to use general anesthesia. Immediately prior                         to administration of medications, the patient was                         re-assessed for adequacy to receive sedatives. The                         heart rate, respiratory rate, oxygen saturations,                         blood pressure, adequacy of pulmonary ventilation, and                         response to care were monitored throughout the                         procedure. The physical status of the patient was                         re-assessed after the procedure.                        After obtaining informed consent, the colonoscope was                         passed under direct vision. Throughout the procedure,                         the patient's blood pressure, pulse, and oxygen                         saturations were monitored continuously. The  Colonoscope was introduced through the anus and                         advanced to the the cecum, identified by appendiceal                         orifice and ileocecal valve. The colonoscopy was                         performed without difficulty. The patient tolerated                         the procedure well. The quality of the bowel                         preparation was evaluated using the BBPS [facility] Bowel                         Preparation Scale) with scores of: Right Colon = 3,                         Transverse Colon = 3 and Left Colon = 3 (entire mucosa                          seen well with no residual staining, small fragments                         of stool or opaque liquid). The total BBPS score                         equals 9. The ileocecal valve, appendiceal orifice,                         and rectum were photographed. Findings:      The perianal and digital rectal examinations were normal. Pertinent       negatives include normal sphincter tone and no palpable rectal lesions.      The entire examined colon appeared normal.      The retroflexed view of the distal rectum and anal verge was normal and       showed no anal or rectal abnormalities. Impression:            - The entire examined colon is normal.                        - The distal rectum and anal verge are normal on                         retroflexion view.                        - No specimens collected. Recommendation:        - Discharge patient to home (with escort).                        - Resume previous diet today.                        -  Continue present medications.                        - Repeat colonoscopy in 10 years for screening                         purposes. Procedure Code(s):     --- Professional ---                        H9878, Colorectal cancer screening; colonoscopy on                         individual not meeting criteria for high risk Diagnosis Code(s):     --- Professional ---                        Z12.11, Encounter for screening for malignant neoplasm                         of colon CPT copyright 2022 American Medical Association. All rights reserved. The codes documented in this report are preliminary and upon coder review may  be revised to meet current compliance requirements. Dr. Corinn Brooklyn Corinn Jess Brooklyn MD, MD 01/27/2024 9:34:17 AM This report has been signed electronically. Number of Addenda: 0 Note Initiated On: 01/27/2024 9:14 AM Scope Withdrawal Time: 0 hours 6 minutes 32 seconds  Total Procedure Duration: 0 hours 9 minutes 54  seconds  Estimated Blood Loss:  Estimated blood loss: none.      [facility]

## 2024-01-27 NOTE — H&P (Signed)
 Kayla JONELLE Brooklyn, MD [facility], [facility] [address]  [place], [place] [address]  Main: [phone] Fax:  [phone] Pager: [phone]   Primary Care Physician:  Cletus Glenn Primary Gastroenterologist:  Dr. Corinn JONELLE Mcintosh  Pre-Procedure History & Physical: HPI:  Kayla Mcintosh is a 53 y.o. female is here for an colonoscopy.   Past Medical History:  Diagnosis Date   Bronchitis    HYPERLIPIDEMIA 11/08/2008   HYPERTENSION 11/08/2008   LEUKOCYTOSIS UNSPECIFIED 11/08/2008   pt normally runs WBC 17,000-30,000    Past Surgical History:  Procedure Laterality Date   BILATERAL SALPINGECTOMY Bilateral 08/17/2021   Procedure: BILATERAL SALPINGECTOMY;  Surgeon: Lovetta Debby PARAS, MD;  Location: [facility] ORS;  Service: Gynecology;  Laterality: Bilateral;   DILATATION & CURETTAGE/HYSTEROSCOPY WITH MYOSURE N/A 11/12/2014   Procedure: DILATATION & CURETTAGE/HYSTEROSCOPY WITH MYOSURE;  Surgeon: Glory High, MD;  Location: [facility] ORS;  Service: Gynecology;  Laterality: N/A;   TUBAL LIGATION  2009   VAGINAL HYSTERECTOMY N/A 08/17/2021   Procedure: HYSTERECTOMY VAGINAL;  Surgeon: Lovetta Debby PARAS, MD;  Location: [facility] ORS;  Service: Gynecology;  Laterality: N/A;    Prior to Admission medications   Medication Sig Start Date End Date Taking? Authorizing Provider  Ascorbic Acid (VITAMIN C PO) Take 1 tablet by mouth in the morning.   Yes [provider]  chlorhexidine  (PERIDEX ) 0.12 % solution Use as directed 15 mLs in the mouth or throat daily as needed.   Yes [provider]  Cholecalciferol (VITAMIN D3 PO) Take 1 tablet by mouth in the morning.   Yes [provider]  Cyanocobalamin  (VITAMIN B-12 PO) Take 1 tablet by mouth daily.   Yes [provider]  hydrochlorothiazide  (HYDRODIURIL ) 12.5 MG tablet Take 12.5 mg by mouth in the morning. 05/01/21  Yes [provider]  montelukast (SINGULAIR) 10 MG tablet  Take 10 mg by mouth in the morning. 05/01/21  Yes [provider]  albuterol  (PROAIR  HFA) 108 (90 Base) MCG/ACT inhaler Inhale 2 puffs into the lungs every 6 (six) hours as needed for wheezing or shortness of breath. Patient not taking: Reported on 08/17/2021 09/17/18   Osker Tinnie HERO, FNP  cyclobenzaprine  (FLEXERIL ) 5 MG tablet Take 1 tablet (5 mg total) by mouth at bedtime as needed. 09/17/18   Osker Tinnie HERO, FNP  ibuprofen (ADVIL) 200 MG tablet Take 600 mg by mouth every 8 (eight) hours as needed (for pain.).    [provider]  naproxen  (NAPROSYN ) 500 MG tablet Take 1 tablet (500 mg total) by mouth 3 (three) times daily as needed. 09/17/18   Osker Tinnie HERO, FNP  PRESCRIPTION MEDICATION Place 1 application. into both ears every 30 (thirty) days. mometasone furoate Topical solution for itching    [provider]  promethazine  (PHENERGAN ) 25 MG tablet Take one tablet by mouth every 6 hours as needed for headaches. 09/17/18   Osker Tinnie HERO, FNP  rizatriptan  (MAXALT ) 10 MG tablet Take 1 tablet (10 mg total) by mouth as needed. May repeat in 2 hours if needed 09/17/18   Osker Tinnie HERO, FNP    Allergies as of 01/14/2024   (No Known Allergies)    Family History  Problem Relation Age of Onset   Breast cancer Maternal Grandmother    Heart disease Other    Cancer Other        breast grandmother, lung   Alcohol abuse Other        parent, blood relative   Hypertension Other  Social History   Socioeconomic History   Marital status: Single    Spouse name: Not on file   Number of children: Not on file   Years of education: Not on file   Highest education level: Not on file  Occupational History   Not on file  Tobacco Use   Smoking status: Every Day    Current packs/day: 1.50    Types: Cigarettes   Smokeless tobacco: Never  Vaping Use   Vaping status: Never Used  Substance and Sexual Activity   Alcohol use: No   Drug use: Yes    Types: Marijuana    Comment:  marijuana daily use   Sexual activity: Yes    Birth control/protection: Surgical  Other Topics Concern   Not on file  Social History Narrative   Not on file   Social Drivers of Health   Financial Resource Strain: Low Risk  (10/17/2023)   Received from [facility]   Overall Financial Resource Strain (CARDIA)    Difficulty of Paying Living Expenses: Not hard at all  Food Insecurity: No Food Insecurity (10/17/2023)   [facility] from [facility]   Hunger Vital Sign    Within the past 12 months, you worried that your food would run out before you got the money to buy more.: Never true    Within the past 12 months, the food you bought just didn't last and you didn't have money to get more.: Never true  Transportation Needs: No Transportation Needs (10/17/2023)   Received from [facility] - Transportation    In the past 12 months, has lack of transportation kept you from medical appointments or from getting medications?: No    Lack of Transportation (Non-Medical): No  Physical Activity: Insufficiently Active (02/25/2020)   Received from [facility]   Exercise Vital Sign    On average, how many days per week do you engage in moderate to strenuous exercise (like a brisk walk)?: 1 day    On average, how many minutes do you engage in exercise at this level?: 60 min  Stress: Stress Concern Present (02/25/2020)   Received from [facility] - Occupational Stress Questionnaire    Feeling of Stress : To some extent  Social Connections: Moderately Isolated (02/25/2020)   Received from [facility]   Social Connection and Isolation Panel    In a typical week, how many times do you talk on the phone with family, friends, or neighbors?: More than three times a week    How often do you get together with friends or relatives?: Twice a week    How often  do you attend church or religious services?: More than 4 times per year    Do you belong to any clubs or organizations such as church groups, unions, fraternal or athletic groups, or school groups?: No    How often do you attend meetings of the clubs or organizations you belong to?: Never    Are you married, widowed, divorced, separated, never married, or living with a partner?: Divorced  Intimate Partner Violence: Not on file    Review of Systems: See HPI, otherwise negative ROS  Physical Exam: BP 116/82   Pulse 72   Temp (!) 96.8 F (36 C) (Temporal)   Resp 12   Ht 5' 0.98 (1.549 m)   Wt 52.1 kg   LMP 07/07/2016 (Exact  Date)   SpO2 93%   BMI 21.72 kg/m  General:   Alert,  pleasant and cooperative in NAD Head:  Normocephalic and atraumatic. Neck:  Supple; no masses or thyromegaly. Lungs:  Clear throughout to auscultation.    Heart:  Regular rate and rhythm. Abdomen:  Soft, nontender and nondistended. Normal bowel sounds, without guarding, and without rebound.   Neurologic:  Alert and  oriented x4;  grossly normal neurologically.  Impression/Plan: Eleri Ruben is here for an colonoscopy to be performed for colon cancer screening  Risks, benefits, limitations, and alternatives regarding  colonoscopy have been reviewed with the patient.  Questions have been answered.  All parties agreeable.   Kayla Brooklyn, MD  01/27/2024, 8:41 AM

## 2024-01-27 NOTE — Anesthesia Postprocedure Evaluation (Signed)
 Anesthesia Post Note  Patient: Kayla Mcintosh  Procedure(s) Performed: COLONOSCOPY (Rectum)  Patient location during evaluation: PACU Anesthesia Type: General Level of consciousness: awake and alert Pain management: pain level controlled Vital Signs Assessment: post-procedure vital signs reviewed and stable Respiratory status: spontaneous breathing, nonlabored ventilation, respiratory function stable and patient connected to nasal cannula oxygen Cardiovascular status: blood pressure returned to baseline and stable Postop Assessment: no apparent nausea or vomiting Anesthetic complications: no   No notable events documented.   Last Vitals:  Vitals:   01/27/24 0938 01/27/24 0943  BP: 106/68 105/82  Pulse: 76 75  Resp: 19 20  Temp: (!) 36.3 C 36.6 C  SpO2: 94% 100%    Last Pain:  Vitals:   01/27/24 0943  TempSrc:   PainSc: 0-No pain                 Donny JAYSON Mu

## 2024-01-27 NOTE — Transfer of Care (Signed)
 Immediate Anesthesia Transfer of Care Note  Patient: Kayla Mcintosh  Procedure(s) Performed: COLONOSCOPY (Rectum)  Patient Location: PACU  Anesthesia Type: General  Level of Consciousness: awake, alert  and patient cooperative  Airway and Oxygen Therapy: Patient Spontanous Breathing and Patient connected to supplemental oxygen  Post-op Assessment: Post-op Vital signs reviewed, Patient's Cardiovascular Status Stable, Respiratory Function Stable, Patent Airway and No signs of Nausea or vomiting  Post-op Vital Signs: Reviewed and stable  Complications: No notable events documented.
